# Patient Record
Sex: Female | Born: 1955
Health system: Southern US, Community
[De-identification: ages and names within clinical notes are randomized; demographics above are authoritative.]

## PROBLEM LIST (undated history)

## (undated) DIAGNOSIS — I471 Supraventricular tachycardia, unspecified: Secondary | ICD-10-CM

## (undated) DIAGNOSIS — M8580 Other specified disorders of bone density and structure, unspecified site: Secondary | ICD-10-CM

## (undated) DIAGNOSIS — E7800 Pure hypercholesterolemia, unspecified: Secondary | ICD-10-CM

## (undated) DIAGNOSIS — I341 Nonrheumatic mitral (valve) prolapse: Secondary | ICD-10-CM

## (undated) DIAGNOSIS — T7840XA Allergy, unspecified, initial encounter: Secondary | ICD-10-CM

## (undated) DIAGNOSIS — E785 Hyperlipidemia, unspecified: Secondary | ICD-10-CM

## (undated) DIAGNOSIS — J45909 Unspecified asthma, uncomplicated: Secondary | ICD-10-CM

## (undated) HISTORY — DX: Supraventricular tachycardia: I47.1

## (undated) HISTORY — DX: Hyperlipidemia, unspecified: E78.5

## (undated) HISTORY — DX: Pure hypercholesterolemia, unspecified: E78.00

## (undated) HISTORY — DX: Unspecified asthma, uncomplicated: J45.909

## (undated) HISTORY — DX: Other specified disorders of bone density and structure, unspecified site: M85.80

## (undated) HISTORY — DX: Nonrheumatic mitral (valve) prolapse: I34.1

## (undated) HISTORY — DX: Supraventricular tachycardia, unspecified: I47.10

## (undated) HISTORY — DX: Allergy, unspecified, initial encounter: T78.40XA

## (undated) HISTORY — PX: SPINE SURGERY: SHX786

---

## 1986-09-22 HISTORY — PX: TUBAL LIGATION: SHX77

## 1998-09-22 HISTORY — PX: LUMBAR FUSION: SHX111

## 1999-02-22 ENCOUNTER — Ambulatory Visit (HOSPITAL_COMMUNITY): Admission: RE | Admit: 1999-02-22 | Discharge: 1999-02-22 | Payer: Self-pay | Admitting: Family Medicine

## 1999-04-05 ENCOUNTER — Encounter: Payer: Self-pay | Admitting: Neurosurgery

## 1999-04-09 ENCOUNTER — Encounter: Payer: Self-pay | Admitting: Neurosurgery

## 1999-04-09 ENCOUNTER — Inpatient Hospital Stay (HOSPITAL_COMMUNITY): Admission: RE | Admit: 1999-04-09 | Discharge: 1999-04-12 | Payer: Self-pay | Admitting: Neurosurgery

## 1999-07-08 ENCOUNTER — Other Ambulatory Visit: Admission: RE | Admit: 1999-07-08 | Discharge: 1999-07-08 | Payer: Self-pay | Admitting: Obstetrics and Gynecology

## 1999-07-16 ENCOUNTER — Encounter: Admission: RE | Admit: 1999-07-16 | Discharge: 1999-07-16 | Payer: Self-pay | Admitting: Obstetrics and Gynecology

## 1999-07-16 ENCOUNTER — Encounter: Payer: Self-pay | Admitting: Obstetrics and Gynecology

## 1999-07-16 ENCOUNTER — Encounter: Admission: RE | Admit: 1999-07-16 | Discharge: 1999-08-20 | Payer: Self-pay | Admitting: Neurosurgery

## 2000-10-12 ENCOUNTER — Other Ambulatory Visit: Admission: RE | Admit: 2000-10-12 | Discharge: 2000-10-12 | Payer: Self-pay | Admitting: Obstetrics and Gynecology

## 2001-04-06 ENCOUNTER — Other Ambulatory Visit: Admission: RE | Admit: 2001-04-06 | Discharge: 2001-04-06 | Payer: Self-pay | Admitting: Obstetrics and Gynecology

## 2003-05-26 ENCOUNTER — Encounter: Payer: Self-pay | Admitting: Rheumatology

## 2003-05-26 ENCOUNTER — Ambulatory Visit (HOSPITAL_COMMUNITY): Admission: RE | Admit: 2003-05-26 | Discharge: 2003-05-26 | Payer: Self-pay | Admitting: Rheumatology

## 2003-06-05 ENCOUNTER — Other Ambulatory Visit: Admission: RE | Admit: 2003-06-05 | Discharge: 2003-06-05 | Payer: Self-pay | Admitting: *Deleted

## 2003-06-09 ENCOUNTER — Encounter: Admission: RE | Admit: 2003-06-09 | Discharge: 2003-07-06 | Payer: Self-pay | Admitting: Rheumatology

## 2004-12-02 ENCOUNTER — Other Ambulatory Visit: Admission: RE | Admit: 2004-12-02 | Discharge: 2004-12-02 | Payer: Self-pay | Admitting: *Deleted

## 2005-07-08 ENCOUNTER — Encounter: Admission: RE | Admit: 2005-07-08 | Discharge: 2005-07-08 | Payer: Self-pay | Admitting: *Deleted

## 2007-09-03 ENCOUNTER — Encounter: Payer: Self-pay | Admitting: Cardiovascular Disease

## 2008-06-20 ENCOUNTER — Other Ambulatory Visit: Admission: RE | Admit: 2008-06-20 | Discharge: 2008-06-20 | Payer: Self-pay | Admitting: Obstetrics and Gynecology

## 2008-07-21 ENCOUNTER — Encounter: Payer: Self-pay | Admitting: Cardiovascular Disease

## 2008-07-27 ENCOUNTER — Encounter: Payer: Self-pay | Admitting: Cardiovascular Disease

## 2008-08-08 ENCOUNTER — Other Ambulatory Visit: Admission: RE | Admit: 2008-08-08 | Discharge: 2008-08-08 | Payer: Self-pay | Admitting: Obstetrics and Gynecology

## 2009-08-14 ENCOUNTER — Encounter (INDEPENDENT_AMBULATORY_CARE_PROVIDER_SITE_OTHER): Payer: Self-pay | Admitting: *Deleted

## 2009-08-14 ENCOUNTER — Encounter: Admission: RE | Admit: 2009-08-14 | Discharge: 2009-08-14 | Payer: Self-pay | Admitting: Obstetrics & Gynecology

## 2009-08-15 ENCOUNTER — Ambulatory Visit: Payer: Self-pay | Admitting: Gastroenterology

## 2009-09-19 ENCOUNTER — Ambulatory Visit: Payer: Self-pay | Admitting: Gastroenterology

## 2009-09-24 ENCOUNTER — Encounter: Payer: Self-pay | Admitting: Gastroenterology

## 2009-10-26 ENCOUNTER — Encounter: Payer: Self-pay | Admitting: Cardiovascular Disease

## 2009-10-26 DIAGNOSIS — E7800 Pure hypercholesterolemia, unspecified: Secondary | ICD-10-CM

## 2009-10-26 DIAGNOSIS — R079 Chest pain, unspecified: Secondary | ICD-10-CM

## 2009-10-26 DIAGNOSIS — R5383 Other fatigue: Secondary | ICD-10-CM

## 2009-10-26 DIAGNOSIS — R002 Palpitations: Secondary | ICD-10-CM | POA: Insufficient documentation

## 2009-10-26 DIAGNOSIS — R5381 Other malaise: Secondary | ICD-10-CM

## 2009-10-29 ENCOUNTER — Ambulatory Visit: Payer: Self-pay | Admitting: Cardiovascular Disease

## 2009-10-29 DIAGNOSIS — Z8679 Personal history of other diseases of the circulatory system: Secondary | ICD-10-CM | POA: Insufficient documentation

## 2009-10-30 ENCOUNTER — Ambulatory Visit: Payer: Self-pay | Admitting: Cardiovascular Disease

## 2009-10-31 ENCOUNTER — Encounter: Payer: Self-pay | Admitting: Cardiovascular Disease

## 2009-10-31 ENCOUNTER — Ambulatory Visit: Payer: Self-pay

## 2009-10-31 ENCOUNTER — Ambulatory Visit: Payer: Self-pay | Admitting: Cardiovascular Disease

## 2009-10-31 ENCOUNTER — Ambulatory Visit (HOSPITAL_COMMUNITY): Admission: RE | Admit: 2009-10-31 | Discharge: 2009-10-31 | Payer: Self-pay | Admitting: Cardiovascular Disease

## 2009-11-01 ENCOUNTER — Encounter (INDEPENDENT_AMBULATORY_CARE_PROVIDER_SITE_OTHER): Payer: Self-pay | Admitting: *Deleted

## 2009-11-01 LAB — CONVERTED CEMR LAB
Albumin: 4.3 g/dL (ref 3.5–5.2)
Basophils Relative: 0.1 % (ref 0.0–3.0)
Bilirubin, Direct: 0 mg/dL (ref 0.0–0.3)
CO2: 29 meq/L (ref 19–32)
Chloride: 108 meq/L (ref 96–112)
Cholesterol: 191 mg/dL (ref 0–200)
Eosinophils Relative: 1 % (ref 0.0–5.0)
HCT: 36.7 % (ref 36.0–46.0)
Hemoglobin: 12.5 g/dL (ref 12.0–15.0)
LDL Cholesterol: 133 mg/dL — ABNORMAL HIGH (ref 0–99)
Lymphs Abs: 1.4 10*3/uL (ref 0.7–4.0)
MCV: 94.5 fL (ref 78.0–100.0)
Monocytes Absolute: 0.3 10*3/uL (ref 0.1–1.0)
Monocytes Relative: 6.1 % (ref 3.0–12.0)
Neutro Abs: 3.8 10*3/uL (ref 1.4–7.7)
Potassium: 4.6 meq/L (ref 3.5–5.1)
TSH: 0.78 microintl units/mL (ref 0.35–5.50)
Total CHOL/HDL Ratio: 4
Total Protein: 7.5 g/dL (ref 6.0–8.3)
Triglycerides: 66 mg/dL (ref 0.0–149.0)
VLDL: 13.2 mg/dL (ref 0.0–40.0)
WBC: 5.6 10*3/uL (ref 4.5–10.5)

## 2009-12-20 ENCOUNTER — Telehealth: Payer: Self-pay | Admitting: Cardiovascular Disease

## 2009-12-27 ENCOUNTER — Ambulatory Visit: Payer: Self-pay | Admitting: Cardiovascular Disease

## 2009-12-27 DIAGNOSIS — J45909 Unspecified asthma, uncomplicated: Secondary | ICD-10-CM

## 2009-12-28 ENCOUNTER — Ambulatory Visit: Payer: Self-pay | Admitting: Internal Medicine

## 2009-12-28 DIAGNOSIS — I471 Supraventricular tachycardia, unspecified: Secondary | ICD-10-CM | POA: Insufficient documentation

## 2010-02-14 ENCOUNTER — Telehealth: Payer: Self-pay | Admitting: Cardiovascular Disease

## 2010-04-02 ENCOUNTER — Ambulatory Visit: Payer: Self-pay | Admitting: Cardiovascular Disease

## 2010-05-20 ENCOUNTER — Ambulatory Visit: Payer: Self-pay | Admitting: Internal Medicine

## 2010-07-02 ENCOUNTER — Telehealth: Payer: Self-pay | Admitting: Internal Medicine

## 2010-07-04 ENCOUNTER — Telehealth: Payer: Self-pay | Admitting: Internal Medicine

## 2010-08-12 ENCOUNTER — Encounter: Payer: Self-pay | Admitting: Internal Medicine

## 2010-08-22 ENCOUNTER — Ambulatory Visit: Payer: Self-pay | Admitting: Internal Medicine

## 2010-08-22 HISTORY — PX: OTHER SURGICAL HISTORY: SHX169

## 2010-08-22 LAB — CONVERTED CEMR LAB
BUN: 9 mg/dL (ref 6–23)
Basophils Absolute: 0 10*3/uL (ref 0.0–0.1)
Basophils Relative: 0.5 % (ref 0.0–3.0)
CO2: 28 meq/L (ref 19–32)
Chloride: 101 meq/L (ref 96–112)
Creatinine, Ser: 0.7 mg/dL (ref 0.4–1.2)
Eosinophils Absolute: 0.1 10*3/uL (ref 0.0–0.7)
Glucose, Bld: 90 mg/dL (ref 70–99)
MCHC: 33.7 g/dL (ref 30.0–36.0)
MCV: 94.4 fL (ref 78.0–100.0)
Monocytes Absolute: 0.3 10*3/uL (ref 0.1–1.0)
Neutrophils Relative %: 56.7 % (ref 43.0–77.0)
Platelets: 239 10*3/uL (ref 150.0–400.0)
Prothrombin Time: 10.1 s (ref 9.7–11.8)
RBC: 4.03 M/uL (ref 3.87–5.11)
RDW: 12.9 % (ref 11.5–14.6)

## 2010-08-27 ENCOUNTER — Telehealth: Payer: Self-pay | Admitting: Internal Medicine

## 2010-08-29 ENCOUNTER — Ambulatory Visit (HOSPITAL_COMMUNITY)
Admission: RE | Admit: 2010-08-29 | Discharge: 2010-08-29 | Payer: Self-pay | Source: Home / Self Care | Admitting: Internal Medicine

## 2010-09-20 ENCOUNTER — Encounter
Admission: RE | Admit: 2010-09-20 | Discharge: 2010-09-20 | Payer: Self-pay | Source: Home / Self Care | Attending: Obstetrics & Gynecology | Admitting: Obstetrics & Gynecology

## 2010-10-07 ENCOUNTER — Ambulatory Visit
Admission: RE | Admit: 2010-10-07 | Discharge: 2010-10-07 | Payer: Self-pay | Source: Home / Self Care | Attending: Internal Medicine | Admitting: Internal Medicine

## 2010-10-07 ENCOUNTER — Encounter: Payer: Self-pay | Admitting: Internal Medicine

## 2010-10-24 ENCOUNTER — Telehealth: Payer: Self-pay | Admitting: Internal Medicine

## 2010-10-24 NOTE — Progress Notes (Signed)
Summary: SVT ablation  Phone Note Outgoing Call   Call placed by: Dennis Bast, RN, BSN,  July 02, 2010 12:01 PM Call placed to: Patient Summary of Call:  I called to schedule her for her SVT ablation.  Patient states she goes back and forth on doing the procedure at all.  She says her calander is very full and she may be traveling some.  So, she is going to call me back as to a date which she wants to do procedure. Dennis Bast, RN, BSN  July 02, 2010 12:03 PM

## 2010-10-24 NOTE — Letter (Signed)
Summary: Family Practice at [facility] at [facility]   Imported By: Earl Many 10/26/2009 17:46:52  _____________________________________________________________________  External Attachment:    Type:   Image     Comment:   External Document

## 2010-10-24 NOTE — Miscellaneous (Signed)
  Clinical Lists Changes  Medications: Changed medication from PRAVACHOL 20 MG TABS (PRAVASTATIN SODIUM) 1 tab by mouth once daily to PRAVASTATIN SODIUM 40 MG TABS (PRAVASTATIN SODIUM) Take one tablet by mouth daily at bedtime - Signed Rx of PRAVASTATIN SODIUM 40 MG TABS (PRAVASTATIN SODIUM) Take one tablet by mouth daily at bedtime;  #30 x 12;  Signed;  Entered by: Deliah Goody, RN;  Authorized by: Colon Branch, MD, [facility];  Method used: Electronically to [facility]*, [address], [address] Shipping/mailing, [place], [place]  [address], Ph: [phone], Fax: [phone]    Prescriptions: PRAVASTATIN SODIUM 40 MG TABS (PRAVASTATIN SODIUM) Take one tablet by mouth daily at bedtime  #30 x 12   Entered by:   Deliah Goody, RN   Authorized by:   Colon Branch, MD, [facility]   Signed by:   Deliah Goody, RN on 11/01/2009   Method used:   Electronically to        [facility]* (retail)       [address]       [address] Shipping/mailing       [place], [place]  [address]       Ph: [phone]       Fax: [phone]   RxID:   [id number]

## 2010-10-24 NOTE — Assessment & Plan Note (Signed)
Summary: EVAL FOR   Visit Type:  Initial Consult Referring Provider:  Dr Eden Emms Primary Provider:  [name]   History of Present Illness: Teresa Fitzgerald is a pleasant 55 yo WF with a h/o asthma and recently diagnosed SVT who presents for EP consultation.  She reports having palpitations "for years" but feels that they have recently worsened.  She describes episodes as abrupt onset and offset of tachypalpitations with associated SOB.  She often feels her heart beat into her neck.  She feels "washed out" afterwards.  She is unaware of triggers for her episodes and often finds that episodes occur at rest.  She reports that presently she has episodes on a daily basis.  Episodes typically last 2-3 minutes.  She denies any more prolonged episodes.  She reports having episodes of syncope in her 30s but denies recent episodes.  She has been reluctant to take medicines for this.  Current Medications (verified): 1)  Pravastatin Sodium 40 Mg Tabs (Pravastatin Sodium) .... Take One Tablet By Mouth Daily At Bedtime 2)  Advair Diskus 250-50 Mcg/dose Aepb (Fluticasone-Salmeterol) .Marland Kitchen.. 1 Puff Two Times A Day 3)  Cetirizine Hcl 10 Mg Tabs (Cetirizine Hcl) .Marland Kitchen.. 1 Tab By Mouth Once Daily 4)  Calcarb 600 1500 Mg Tabs (Calcium Carbonate) .Marland Kitchen.. 1 Tab By Mouth Two Times A Day 5)  Magnesium Oxide 500 Mg Tabs (Magnesium Oxide) .Marland Kitchen.. 1 Tab By Mouth Once Daily 6)  Fish Oil 1200 Mg Caps (Omega-3 Fatty Acids) .... 2 Caps Once Daily 7)  Vitamin D (Ergocalciferol) 50000 Unit Caps (Ergocalciferol) .... Every 2 Weeks 8)  Aspirin Ec 325 Mg Tbec (Aspirin) .... Take One Tablet By Mouth Daily  Allergies (verified): No Known Drug Allergies  Past History:  Past Medical History: SVT HYPERCHOLESTEROLEMIA (ICD-272.0) Mitral valve prolapse Asthma Allergic rhinitis  Past Surgical History: lumbar fusion 2000  Family History: Reviewed history from 10/26/2009 and no changes required. asthma Cancer Hypertension  Social  History: Works as a Sports coach for [employer] Regular Exercise - yes Yoga Married x 2  Lives in [place]. 3 daughters Nonsmoker Nondrinker  Review of Systems       All systems are reviewed and negative except as listed in the HPI.   Vital Signs:  Patient profile:   55 year old female Height:      67 inches Weight:      147 pounds BMI:     23.11 Pulse rate:   63 / minute BP sitting:   122 / 84  (left arm)  Vitals Entered By: Laurance Flatten CMA (December 28, 2009 9:24 AM)  Physical Exam  General:  Well developed, well nourished, in no acute distress. Head:  normocephalic and atraumatic Eyes:  PERRLA/EOM intact; conjunctiva and lids normal. Mouth:  Teeth, gums and palate normal. Oral mucosa normal. Neck:  Neck supple, no JVD. No masses, thyromegaly or abnormal cervical nodes. Lungs:  Clear bilaterally to auscultation and percussion. Heart:  Non-displaced PMI, chest non-tender; regular rate and rhythm, S1, S2 without murmurs, rubs or gallops. Carotid upstroke normal, no bruit. Normal abdominal aortic size, no bruits. Femorals normal pulses, no bruits. Pedals normal pulses. No edema, no varicosities. Abdomen:  Bowel sounds positive; abdomen soft and non-tender without masses, organomegaly, or hernias noted. No hepatosplenomegaly. Msk:  Back normal, normal gait. Muscle strength and tone normal. Pulses:  pulses normal in all 4 extremities Extremities:  No clubbing or cyanosis. Neurologic:  Alert and oriented x 3. Skin:  Intact without lesions or rashes.  Cervical Nodes:  no significant adenopathy Psych:  Normal affect.   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  I have reviewed her echo which reveals no significant  structural concerns.  I also reviewed her event monitor which reveals a long RP narrow complex tachycardia, possibly atrial tachycardia. Therapeutic strategies for SVT including medicine and ablation were discussed in detail with the patient today. Risk, benefits, and alternatives to EP study and radiofrequency ablation were also discussed in detail today.  At this point, the patient is not ready to proceed with ablation. She also does not want to take a daily medicine. I have therefore recommended that she take cardizem 30mg  as needed for episodes as a "pill-in-pocket" approach.  If cardizem is not effective then she could use flecainide "pill in pocket" or take daily flecainide if episodes increase. I think that she would be a good candidate for carto guided ablation, however, she is not ready for this. I have provided handouts on SVT and ablation today.  She will follow closely with Dr Eden Emms and contact my office if she wishes to proceed with ablation.  Other Orders: EKG w/ Interpretation (93000)  Patient Instructions: 1)  Your physician has recommended you make the following change in your medication: Start Cardizem 30mg  as needed . 2)  Your physician recommends that you schedule a follow-up as scheduled with Dr. Eden Emms. Prescriptions: CARDIZEM 30 MG TABS (DILTIAZEM HCL) 1 tablet every 6 hours as needed .  #30 x 2   Entered by:   Laurance Flatten CMA   Authorized by:   Hillis Range, MD   Signed by:   Laurance Flatten CMA on 12/28/2009   Method used:   Electronically to        [facility]* (retail)       [address]       [address] Shipping/mailing       [place], [place]  [address]       Ph: [phone]       Fax: [phone]   RxID:   [id number]

## 2010-10-24 NOTE — Progress Notes (Signed)
Summary: need clarification  Phone Note Refill Request Call back at [phone] Message from:  Pharmacy on [facility]  Refills Requested: Medication #1:  CARDIZEM 30 MG TABS 1 tablet every 6 hours as needed .. they need clarification  Initial call taken by: Omer Jack,  Feb 14, 2010 12:21 PM  Follow-up for Phone Call        Verified with EP this is correct. Told pharmacy to fill as witten Follow-up by: [name],  Feb 14, 2010 3:22 PM

## 2010-10-24 NOTE — Assessment & Plan Note (Signed)
Summary: F3M   Visit Type:  Follow-up Referring Hayat Warbington:  Dr Eden Emms Primary Devota Viruet:  [name]   History of Present Illness: Ms Teresa Fitzgerald is a pleasant 55 yo WF with a h/o asthma and long RP SVT who presents for EP follow-up.  She reports having palpitations "for years" but feels that they have recently worsened.  She describes episodes as abrupt onset and offset of tachypalpitations with associated SOB.  She often feels her heart beat into her neck.  She feels "washed out" afterwards.  She is unaware of triggers for her episodes and often finds that episodes occur at rest.  She reports that presently she has episodes on a daily basis.  Episodes typically last 2-3 minutes.  She denies any more prolonged episodes.  She has been taking daily cardizem without improvement.  Current Medications (verified): 1)  Pravastatin Sodium 40 Mg Tabs (Pravastatin Sodium) .... Take One Tablet By Mouth Daily At Bedtime 2)  Advair Diskus 250-50 Mcg/dose Aepb (Fluticasone-Salmeterol) .Marland Kitchen.. 1 Puff Two Times A Day 3)  Cetirizine Hcl 10 Mg Tabs (Cetirizine Hcl) .Marland Kitchen.. 1 Tab By Mouth Once Daily 4)  Calcarb 600 1500 Mg Tabs (Calcium Carbonate) .Marland Kitchen.. 1 Tab By Mouth Two Times A Day 5)  Magnesium Oxide 500 Mg Tabs (Magnesium Oxide) .Marland Kitchen.. 1 Tab By Mouth Once Daily 6)  Fish Oil 1200 Mg Caps (Omega-3 Fatty Acids) .... 2 Caps Once Daily 7)  Vitamin D (Ergocalciferol) 50000 Unit Caps (Ergocalciferol) .... Every 2 Weeks 8)  Aspirin 81 Mg Tbec (Aspirin) .... Take One Tablet By Mouth Daily 9)  Diltiazem Hcl Er Beads 180 Mg Xr24h-Cap (Diltiazem Hcl Er Beads) .... Take One Capsule By Mouth Daily  Allergies (verified): No Known Drug Allergies  Past History:  Past Medical History: Reviewed history from 12/28/2009 and no changes required. SVT HYPERCHOLESTEROLEMIA (ICD-272.0) Mitral valve prolapse Asthma Allergic rhinitis  Past Surgical History: Reviewed history from 12/28/2009 and no changes required. lumbar fusion  2000  Family History: Reviewed history from 10/26/2009 and no changes required. asthma Cancer Hypertension  Social History: Reviewed history from 12/28/2009 and no changes required. 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 3 daughters Nonsmoker Nondrinker  Review of Systems       All systems are reviewed and negative except as listed in the HPI.   Vital Signs:  Patient profile:   55 year old female Height:      67 inches Weight:      143 pounds BMI:     22.48 BP sitting:   128 / 70  (left arm)  Vitals Entered By: Laurance Flatten CMA (May 20, 2010 12:12 PM)  Physical Exam  General:  Well developed, well nourished, in no acute distress. Head:  normocephalic and atraumatic Eyes:  PERRLA/EOM intact; conjunctiva and lids normal. Mouth:  Teeth, gums and palate normal. Oral mucosa normal. Neck:  Neck supple, no JVD. No masses, thyromegaly or abnormal cervical nodes. Lungs:  Clear bilaterally to auscultation and percussion. Heart:  Non-displaced PMI, chest non-tender; regular rate and rhythm, S1, S2 without murmurs, rubs or gallops. Carotid upstroke normal, no bruit. Normal abdominal aortic size, no bruits. Femorals normal pulses, no bruits. Pedals normal pulses. No edema, no varicosities. Abdomen:  Bowel sounds positive; abdomen soft and non-tender without masses, organomegaly, or hernias noted. No hepatosplenomegaly. Msk:  Back normal, normal gait. Muscle strength and tone normal. Pulses:  pulses normal in all 4 extremities Extremities:  No clubbing or cyanosis. Neurologic:  Alert and oriented x 3. Skin:  Intact without lesions or rashes. Cervical Nodes:  no significant adenopathy Psych:  Normal affect.   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  Therapeutic strategies for SVT including medicine and ablation were discussed in detail with the patient today. Risk, benefits, and alternatives to EP study and radiofrequency ablation  were also discussed in detail today. These risks include but are not limited to stroke, bleeding, vascular damage, tamponade, perforation, damage to the heart other structures, AV block requiring PPM, worsening renal function, and death. The patient understands these risk and wishes to proceed.  We will schedule carto guided EPS at the next available time.  Orders: EKG w/ Interpretation (93000)  Patient Instructions: 1)  Your physician has recommended that you have an ablation.  Catheter ablation is a medical procedure used to treat some cardiac arrhythmias (irregular heartbeats). During catheter ablation, a long, thin, flexible tube is put into a blood vessel in your groin (upper thigh), or neck. This tube is called an ablation catheter. It is then guided to your heart through the blood vessel. Radiofrequency waves destroy small areas of heart tissue where abnormal heartbeats may cause an arrhythmia to start.  Please see the instruction sheet given to you today. 2)  Dr. Jenel Lucks nurse, Tresa Endo, will contact you to schedule this.

## 2010-10-24 NOTE — Progress Notes (Signed)
Summary: Calling to schedule ablation  Phone Note Call from Patient Call back at Work Phone [phone]   Caller: Patient Summary of Call: Pt calling to schedule ablation Initial call taken by: Judie Grieve,  July 04, 2010 12:54 PM  Follow-up for Phone Call        SVT  08/29/10  labs 12/01/11Kelly Elpidio Galea, BSN  July 05, 2010 4:58 PM

## 2010-10-24 NOTE — Letter (Signed)
Summary: [facility]   Imported By: Earl Many 10/26/2009 17:55:57  _____________________________________________________________________  External Attachment:    Type:   Image     Comment:   External Document

## 2010-10-24 NOTE — Assessment & Plan Note (Signed)
Summary: Teresa Fitzgerald   Visit Type:  Follow-up Referring Provider:  Dr Eden Emms Primary Provider:  [name]   History of Present Illness: Teresa Fitzgerald is a pleasant 55 yo WF with a h/o asthma and long RP SVT who presents for EP follow-up.  She was evaluated by me and recently underwent Ep study during which she had no dual av nodal physiology or APs.  Unfortunately, her tachycardia could not be induced and therefore ablation could not be performed. She continues to reports short (< 1-2 minutes) episodes of abrupt onset/ termination and palpitations every 1-2 days.  These episodes are improved but not controlled with cardizem.She denies symptoms of palpitations, chest pain, shortness of breath, orthopnea, PND, lower extremity edema, dizziness, presyncope, syncope, or neurologic sequela. The patient is tolerating medications without difficulties and is otherwise without complaint today.   Current Medications (verified): 1)  Pravastatin Sodium 40 Mg Tabs (Pravastatin Sodium) .... Take One Tablet By Mouth Daily At Bedtime 2)  Advair Diskus 250-50 Mcg/dose Aepb (Fluticasone-Salmeterol) .Marland Kitchen.. 1 Puff Two Times A Day 3)  Cetirizine Hcl 10 Mg Tabs (Cetirizine Hcl) .Marland Kitchen.. 1 Tab By Mouth Once Daily 4)  Calcarb 600 1500 Mg Tabs (Calcium Carbonate) .Marland Kitchen.. 1 Tab By Mouth Two Times A Day 5)  Magnesium Oxide 500 Mg Tabs (Magnesium Oxide) .Marland Kitchen.. 1 Tab By Mouth Once Daily 6)  Fish Oil 1200 Mg Caps (Omega-3 Fatty Acids) .... 2 Caps Once Daily 7)  Vitamin D (Ergocalciferol) 50000 Unit Caps (Ergocalciferol) .... Every 2 Weeks 8)  Aspirin 81 Mg Tbec (Aspirin) .... Take One Tablet By Mouth Daily 9)  Diltiazem Hcl Er Beads 240 Mg Xr24h-Cap (Diltiazem Hcl Er Beads) .... Take One Capsule By Mouth Daily  Allergies (verified): No Known Drug Allergies  Past History:  Past Medical History: SVT, presumed atrial tachycardia not inducible on EPS 12/11 HYPERCHOLESTEROLEMIA (ICD-272.0) Mitral valve prolapse Asthma Allergic  rhinitis  Past Surgical History: lumbar fusion 2000 EP study 12/11  Social History: Reviewed history from 12/28/2009 and no changes required. 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 3 daughters Nonsmoker Nondrinker  Review of Systems       All systems are reviewed and negative except as listed in the HPI.   Vital Signs:  Patient profile:   56 year old female Height:      67 inches Weight:      144 pounds BMI:     22.64 Pulse rate:   60 / minute BP sitting:   120 / 80  (left arm)  Vitals Entered By: Laurance Flatten CMA (October 07, 2010 10:31 AM)  Physical Exam  General:  Well developed, well nourished, in no acute distress. Head:  normocephalic and atraumatic Eyes:  PERRLA/EOM intact; conjunctiva and lids normal. Mouth:  Teeth, gums and palate normal. Oral mucosa normal. Neck:  supple Lungs:  Clear bilaterally to auscultation and percussion. Heart:  Non-displaced PMI, chest non-tender; regular rate and rhythm, S1, S2 without murmurs, rubs or gallops. Carotid upstroke normal, no bruit. Normal abdominal aortic size, no bruits. Femorals normal pulses, no bruits. Pedals normal pulses. No edema, no varicosities. Abdomen:  Bowel sounds positive; abdomen soft and non-tender without masses, organomegaly, or hernias noted. No hepatosplenomegaly. Msk:  Back normal, normal gait. Muscle strength and tone normal. Extremities:  No clubbing or cyanosis. Neurologic:  Alert and oriented x 3.   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  Unfortunately, her SVT was not inducible and therefore could not be ablated at that time.  Clinically, I suspect Atach.  I have reviewed her LifeWatch event  monitor from 2/11 which reveals long RP narrow complex tachycardia.  This is clearly not afib.  At this time, I have recommended flecainide.  The patient remains very clear in her decision to avoid AAD therapy.  We will therefore stop diltiazem and start verapamil 240mg  daily today.  She will return in 3 months. IF she decides to try flecainide, she will contact our office.  Patient Instructions: 1)  Your physician has recommended you make the following change in your medication: stop Cardizem and start Verapamil 240mg  daily 2)  Your physician wants you to follow-up in: 3 months with DrAllred  You will receive a reminder letter in the mail two months in advance. If you don't receive a letter, please call our office to schedule the follow-up appointment. Prescriptions: VERAPAMIL HCL CR 240 MG CR-TABS (VERAPAMIL HCL) one by mouth daily  #30 x 11   Entered by:   Dennis Bast, RN, BSN   Authorized by:   Hillis Range, MD   Signed by:   Dennis Bast, RN, BSN on 10/07/2010   Method used:   Electronically to        [facility]* (retail)       [address]       [address]. Shipping/mailing       [place], [place]  [address]       Ph: [phone]       Fax: [phone]   RxID:   [id number]

## 2010-10-24 NOTE — Miscellaneous (Signed)
Summary: Outpatient Coinsurance Notice  Outpatient Coinsurance Notice   Imported By: Marylou Mccoy 11/20/2009 08:30:23  _____________________________________________________________________  External Attachment:    Type:   Image     Comment:   External Document

## 2010-10-24 NOTE — Letter (Signed)
Summary: ELectrophysiology/Ablation Procedure Instructions  [facility], Main Office  [address]   [place], [place] [address]   Phone: [phone]  Fax: [phone]     Electrophysiology/Ablation Procedure Instructions    You are scheduled for a(n) SVT ablation on 08/29/10 at 7;30am with Dr. Johney Frame.  1.  Please come to the Short Stay Center at [facility] at 5:30am on the day of your procedure.  2.  Come prepared to stay overnight.   Please bring your insurance cards and a list of your medications.  3.  Come to the [facility] office on 08/22/10 for lab work.  .  You do not have to be fasting.  4.  Do not have anything to eat or drink after midnight the night before your procedure.  5.   All of your remaining medications may be taken with a small amount of water.  6.  Educational material received:  Ablation   * Occasionally, EP studies and ablations can become lengthy.  Please make your family aware of this before your procedure starts.  Average time ranges from 2-8 hours for EP studies/ablations.  Your physician will locate your family after the procedure with the results.  * If you have any questions after you get home, please call the office at [phone].  Anselm Pancoast

## 2010-10-24 NOTE — Assessment & Plan Note (Signed)
Summary: 3 month rov .sl   Referring Provider:  Dr Eden Emms Primary Provider:  [name]  CC:  sob.  History of Present Illness: Teresa Fitzgerald is seen today for f/U of SVT, palpitatoins, and MVP.  She saw Dr Johney Frame in April and he felt she is a good candidate for ablation when she is willing.  Script for as needed cardizem given at this time since she does not want to be on daily med.  She has had some success with cardizem but is taking it daily and wants to switch to long acting.  She will reconsider ablation and is comfortable with Dr Johney Frame.  We will arrange for her to see him on 8/29  Palpitations continue on a daily basis.  F/U cholesterol in 2/12 needed.  Echo with mild MVP and trace MR  Current Problems (verified): 1)  Svt/ Psvt/ Pat  (ICD-427.0) 2)  Dyspnea  (ICD-786.05) 3)  Mitral Valve Prolapse, Hx of  (ICD-V12.50) 4)  Chest Pain  (ICD-786.50) 5)  Fatigue  (ICD-780.79) 6)  Palpitations  (ICD-785.1) 7)  Hypercholesterolemia  (ICD-272.0)  Current Medications (verified): 1)  Pravastatin Sodium 40 Mg Tabs (Pravastatin Sodium) .... Take One Tablet By Mouth Daily At Bedtime 2)  Advair Diskus 250-50 Mcg/dose Aepb (Fluticasone-Salmeterol) .Marland Kitchen.. 1 Puff Two Times A Day 3)  Cetirizine Hcl 10 Mg Tabs (Cetirizine Hcl) .Marland Kitchen.. 1 Tab By Mouth Once Daily 4)  Calcarb 600 1500 Mg Tabs (Calcium Carbonate) .Marland Kitchen.. 1 Tab By Mouth Two Times A Day 5)  Magnesium Oxide 500 Mg Tabs (Magnesium Oxide) .Marland Kitchen.. 1 Tab By Mouth Once Daily 6)  Fish Oil 1200 Mg Caps (Omega-3 Fatty Acids) .... 2 Caps Once Daily 7)  Vitamin D (Ergocalciferol) 50000 Unit Caps (Ergocalciferol) .... Every 2 Weeks 8)  Aspirin 81 Mg Tbec (Aspirin) .... Take One Tablet By Mouth Daily 9)  Diltiazem Hcl Er Beads 120 Mg Xr24h-Cap (Diltiazem Hcl Er Beads) .... Take One Capsule By Mouth Daily  Allergies (verified): No Known Drug Allergies  Past History:  Past Medical History: Last updated: 12/28/2009 SVT HYPERCHOLESTEROLEMIA (ICD-272.0) Mitral  valve prolapse Asthma Allergic rhinitis  Past Surgical History: Last updated: 12/28/2009 lumbar fusion 2000  Family History: Last updated: 10/26/2009 asthma Cancer Hypertension  Social History: Last updated: 12/28/2009 Works as a Sports coach for [employer] Regular Exercise - yes Yoga Married x 2  Lives in [place]. 3 daughters Nonsmoker Nondrinker  Review of Systems       Denies fever, malais, weight loss, blurry vision, decreased visual acuity, cough, sputum, SOB, hemoptysis, pleuritic pain,, heartburn, abdominal pain, melena, lower extremity edema, claudication, or rash.   Vital Signs:  Patient profile:   55 year old female Height:      67 inches Weight:      144 pounds BMI:     22.64 Pulse rate:   64 / minute Resp:     12 per minute BP sitting:   118 / 80  (left arm)  Vitals Entered By: [name] (April 02, 2010 9:39 AM)  Physical Exam  General:  Affect appropriate Healthy:  appears stated age HEENT: normal Neck supple with no adenopathy JVP normal no bruits no thyromegaly Lungs clear with no wheezing and good diaphragmatic motion Heart:  S1/S2 no murmur,rub, gallop or click PMI normal Abdomen: benighn, BS positve, no tenderness, no AAA no bruit.  No HSM or HJR Distal pulses intact with no bruits No edema Neuro non-focal Skin warm and dry    Impression & Recommendations:  Problem # 1:  SVT/ PSVT/ PAT (ICD-427.0) Change to LA cardizem.  F/U [facility].   Her updated medication list for this problem includes:    Aspirin 81 Mg Tbec (Aspirin) .Marland Kitchen... Take one tablet by mouth daily    Diltiazem Hcl Er Beads 120 Mg Xr24h-cap (Diltiazem hcl er beads) .Marland Kitchen... Take one capsule by mouth daily  Problem # 2:  MITRAL VALVE PROLAPSE, HX OF (ICD-V12.50) Stable F/U echo in 2 years.  No SBE needed  Problem # 3:  HYPERCHOLESTEROLEMIA (ICD-272.0) At goal with no known vascular disease Her updated medication list for this problem includes:    Pravastatin  Sodium 40 Mg Tabs (Pravastatin sodium) .Marland Kitchen... Take one tablet by mouth daily at bedtime  CHOL: 191 (10/30/2009)   LDL: 133 (10/30/2009)   HDL: 44.60 (10/30/2009)   TG: 66.0 (10/30/2009)  Patient Instructions: 1)  Your physician recommends that you schedule a follow-up appointment in: 6 months 2)  Your physician has recommended you make the following change in your medication: STOP CARDIZEM 3)  START DILTIAZEM CD 120MG  ONE TABLET ONCE DAILY 4)  NEED TO SEE DR [facility] IN 3 MONTHS TO DISCUSS ABLATION Prescriptions: DILTIAZEM HCL ER BEADS 120 MG XR24H-CAP (DILTIAZEM HCL ER BEADS) Take one capsule by mouth daily  #30 x 12   Entered by:   Deliah Goody, RN   Authorized by:   Colon Branch, MD, [facility]   Signed by:   Deliah Goody, RN on 04/02/2010   Method used:   Electronically to        [facility]* (retail)       [address]       [address] Shipping/mailing       [place], [place]  [address]       Ph: [phone]       Fax: [phone]   RxID:   [id number]

## 2010-10-24 NOTE — Assessment & Plan Note (Signed)
Summary: np3/ PALPS, PT HAS UMR/ GD   CC:  palpitations also fatigue.  History of Present Illness: Teresa Fitzgerald is seen today at the request of Dr Doristine Counter.  She has a history of MVP, palpitatoins and elevated lipids.  Her last echo was over 10 years ago.  Her palpitations are chronic but worse over the last few months.  She thinks her allegies make them worse.  She denies associated dyspnea, SSCP, syncope or diaphoresis.  She dose yoga and exercise does not seem to make them worse.  She use to do SBE prophylaxis for her "MVP" but hasn't lately.  He last LDL was 172.  She stopped taking her statin for a while when she ran out and needs F/U labs.    Current Problems (verified): 1)  Chest Pain  (ICD-786.50) 2)  Fatigue  (ICD-780.79) 3)  Palpitations  (ICD-785.1) 4)  Hypercholesterolemia  (ICD-272.0)  Current Medications (verified): 1)  Pravachol 20 Mg Tabs (Pravastatin Sodium) .Marland Kitchen.. 1 Tab By Mouth Once Daily 2)  Advair Diskus 250-50 Mcg/dose Aepb (Fluticasone-Salmeterol) .... As Needed 3)  Cetirizine Hcl 10 Mg Tabs (Cetirizine Hcl) .Marland Kitchen.. 1 Tab By Mouth Once Daily 4)  Calcarb 600 1500 Mg Tabs (Calcium Carbonate) .Marland Kitchen.. 1 Tab By Mouth Two Times A Day 5)  Magnesium Oxide 500 Mg Tabs (Magnesium Oxide) .Marland Kitchen.. 1 Tab By Mouth Once Daily 6)  Fish Oil   Oil (Fish Oil) .Marland Kitchen.. 1 Tab By Mouth Once Daily 7)  Vitamin D (Ergocalciferol) 50000 Unit Caps (Ergocalciferol) .... Every 2 Weeks  Allergies (verified): No Known Drug Allergies  Past History:  Past Medical History: Last updated: 10/26/2009 Current Problems:  CHEST PAIN (ICD-786.50) FATIGUE (ICD-780.79) PALPITATIONS (ICD-785.1) HYPERCHOLESTEROLEMIA (ICD-272.0) Mitral valve abnormality  Family History: Last updated: 10/26/2009 asthma Cancer Hypertension  Social History: Last updated: 10/29/2009 HAs worked for [employer] Regular Exercise - yes Yoga Married x 2  3 daughters Nonsmoker Nondrinker  Social History: HAs worked for [employer] Regular Exercise - yes Yoga Married x 2  3 daughters Nonsmoker Nondrinker  Review of Systems       Denies fever, malais, weight loss, blurry vision, decreased visual acuity, cough, sputum, SOB, hemoptysis, pleuritic pain,s, heartburn, abdominal pain, melena, lower extremity edema, claudication, or rash.   Vital Signs:  Patient profile:   55 year old female Height:      67 inches Weight:      144 pounds BMI:     22.64 Pulse rate:   78 / minute Resp:     12 per minute BP sitting:   133 / 74  (right arm)  Vitals Entered By: [name] (October 29, 2009 11:54 AM)  Physical Exam  General:  Affect appropriate Healthy:  appears stated age HEENT: normal Neck supple with no adenopathy JVP normal no bruits no thyromegaly Lungs clear with no wheezing and good diaphragmatic motion Heart:  S1/S2 no murmur,rub, gallop or click PMI normal Abdomen: benighn, BS positve, no tenderness, no AAA no bruit.  No HSM or HJR Distal pulses intact with no bruits No edema Neuro non-focal Skin warm and dry    Impression & Recommendations:  Problem # 1:  PALPITATIONS (ICD-785.1) Event monitor Normal ECG doubt significant arrythmia Orders: Echocardiogram (Echo) Event (Event)  Problem # 2:  HYPERCHOLESTEROLEMIA (ICD-272.0) Check labs and Dr Doristine Counter to adjust meds accordingly Her updated medication list for this problem includes:    Pravachol 20 Mg Tabs (Pravastatin sodium) .Marland Kitchen... 1 tab by mouth once daily  Problem #  3:  MITRAL VALVE PROLAPSE, HX OF (ICD-V12.50) No bad murmurs.  F/U echo  Patient Instructions: 1)  Your physician recommends that you schedule a follow-up appointment in: AS NEEDED PENDING TEST RESULTS 2)  Your physician recommends that you return for lab work [id number] 3)  Your physician has requested that you have an echocardiogram.  Echocardiography is a painless test that uses sound waves to create images of your heart. It provides your doctor with  information about the size and shape of your heart and how well your heart's chambers and valves are working.  This procedure takes approximately one hour. There are no restrictions for this procedure. 4)  Your physician has recommended that you wear an event monitor.  Event monitors are medical devices that record the heart's electrical activity. Doctors most often use these monitors to diagnose arrhythmias. Arrhythmias are problems with the speed or rhythm of the heartbeat. The monitor is a small, portable device. You can wear one while you do your normal daily activities. This is usually used to diagnose what is causing palpitations/syncope (passing out).   EKG Report  Procedure date:  07/27/2008  Findings:      NSR 78 Normal ECG

## 2010-10-24 NOTE — Letter (Signed)
Summary: Patient Notice- Polyp Results  [facility]  [address] [place], [place] [address]   Phone: [phone]  Fax: [phone]        September 24, 2009 MRN: [id number]    Eleen Vea [address] [place], [place]  [address]    Dear Ms. Barnhart,  I am pleased to inform you that the colon polyp(s) removed during your recent colonoscopy was (were) found to be benign (no cancer detected) upon pathologic examination.  I recommend you have a repeat colonoscopy examination in 5_ years to look for recurrent polyps, as having colon polyps increases your risk for having recurrent polyps or even colon cancer in the future.  Should you develop new or worsening symptoms of abdominal pain, bowel habit changes or bleeding from the rectum or bowels, please schedule an evaluation with either your primary care physician or with me.  Additional information/recommendations:  _X_ No further action with gastroenterology is needed at this time. Please      follow-up with your primary care physician for your other healthcare      needs.  __ Please call [phone] to schedule a return visit to review your      situation.  __ Please keep your follow-up visit as already scheduled.  __ Continue treatment plan as outlined the day of your exam.  Please call us if you are having persistent problems or have questions about your condition that have not been fully answered at this time.  Sincerely,  Mardella Layman MD [facility]  This letter has been electronically signed by your physician.  Appended Document: Patient Notice- Polyp Results Letter mailed 1.5.11

## 2010-10-24 NOTE — Assessment & Plan Note (Signed)
Summary: rov/f/u monitor/dm   Visit Type:  f/u on monitor Primary Provider:  [name]  CC:  pt states still having palpatations and sob...  History of Present Illness: Teresa Fitzgerald is seen today in F/U for palpitations dyspnea and ? MVP.  I reviewed her echo which showed normal LV function and mild MR with no prolapse.  I reviewed her event monitor which showed multiple episodes of SVT.  It appeared to be a long RP tachycardia with right atrial morphology.  The patient is not keen on taking a medicine like Flecainide for a long period of time.  I discussed the case with Dr Graciela Husbands and he agreed that her strips likely represent a long RP tach and that Dr. Johney Frame wouild be the best person to consdier ablation with using the CART system.  I tried to answer all of the patients questions regarding the risks and benefits of ablationa and or anti-arrhythmic Rx but I clearly think that given the frequence of her symptoms and fairly young age that ablation is better.  She does not need anticoagulation.  She does have continued episodes of dyspnea that may represent bronchospasm or occult tachycardia since her primary Dr Teresa Fitzgerald has noted her to be in tachycardia without palpitations.  Since her echo was fairly benign , we will have her EPS evaluation before consdiering any further w/U of her dyspnea. She will continue her advair in the mean time.  Current Problems (verified): 1)  Mitral Valve Prolapse, Hx of  (ICD-V12.50) 2)  Chest Pain  (ICD-786.50) 3)  Fatigue  (ICD-780.79) 4)  Palpitations  (ICD-785.1) 5)  Hypercholesterolemia  (ICD-272.0)  Current Medications (verified): 1)  Pravastatin Sodium 40 Mg Tabs (Pravastatin Sodium) .... Take One Tablet By Mouth Daily At Bedtime 2)  Advair Diskus 250-50 Mcg/dose Aepb (Fluticasone-Salmeterol) .Marland Kitchen.. 1 Puff Two Times A Day 3)  Cetirizine Hcl 10 Mg Tabs (Cetirizine Hcl) .Marland Kitchen.. 1 Tab By Mouth Once Daily 4)  Calcarb 600 1500 Mg Tabs (Calcium Carbonate) .Marland Kitchen.. 1 Tab  By Mouth Two Times A Day 5)  Magnesium Oxide 500 Mg Tabs (Magnesium Oxide) .Marland Kitchen.. 1 Tab By Mouth Once Daily 6)  Fish Oil 1200 Mg Caps (Omega-3 Fatty Acids) .... 2 Caps Once Daily 7)  Vitamin D (Ergocalciferol) 50000 Unit Caps (Ergocalciferol) .... Every 2 Weeks 8)  Aspirin Ec 325 Mg Tbec (Aspirin) .... Take One Tablet By Mouth Daily  Allergies (verified): No Known Drug Allergies  Past History:  Past Medical History: Last updated: 10/26/2009 Current Problems:  CHEST PAIN (ICD-786.50) FATIGUE (ICD-780.79) PALPITATIONS (ICD-785.1) HYPERCHOLESTEROLEMIA (ICD-272.0) Mitral valve abnormality  Family History: Last updated: 10/26/2009 asthma Cancer Hypertension  Social History: Last updated: 10/29/2009 HAs worked for [employer] Regular Exercise - yes Yoga Married x 2  3 daughters Nonsmoker Nondrinker  Review of Systems       Denies fever, malais, weight loss, blurry vision, decreased visual acuity, cough, sputum,  hemoptysis, pleuritic pain, , heartburn, abdominal pain, melena, lower extremity edema, claudication, or rash.   Vital Signs:  Patient profile:   55 year old female Height:      67 inches Weight:      146 pounds BMI:     22.95 Pulse rate:   82 / minute Pulse rhythm:   regular BP sitting:   100 / 62  (left arm) Cuff size:   large  Vitals Entered By: Danielle Rankin, CMA (December 27, 2009 3:04 PM)  Physical Exam  General:  Affect appropriate Healthy:  appears stated age  HEENT: normal Neck supple with no adenopathy JVP normal no bruits no thyromegaly Lungs clear with no wheezing and good diaphragmatic motion Heart:  S1/S2 no murmur,rub, gallop or click PMI normal Abdomen: benighn, BS positve, no tenderness, no AAA no bruit.  No HSM or HJR Distal pulses intact with no bruits No edema Neuro non-focal Skin warm and dry    Impression & Recommendations:  Problem # 1:  MITRAL VALVE PROLAPSE, HX OF (ICD-V12.50) No prolapse in echo with only mild MR.   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  Patient does not want Flecainide.  Refer to [facility] for possible ablation Her updated medication list for this problem includes:    Aspirin Ec 325 Mg Tbec (Aspirin) .Marland Kitchen... Take one tablet by mouth daily  Orders: EP Referral (Cardiology EP Ref )  Problem # 3:  HYPERCHOLESTEROLEMIA (ICD-272.0) Continue pravastatin and F/U labs in 6 months Her updated medication list for this problem includes:    Pravastatin Sodium 40 Mg Tabs (Pravastatin sodium) .Marland Kitchen... Take one tablet by mouth daily at bedtime  CHOL: 191 (10/30/2009)   LDL: 133 (10/30/2009)   HDL: 44.60 (10/30/2009)   TG: 66.0 (10/30/2009)  Problem # 4:  DYSPNEA (ICD-786.05) Likely reactive airway disease.  Continue advair and primary F/U  Her updated medication list for this problem includes:    Aspirin Ec 325 Mg Tbec (Aspirin) .Marland Kitchen... Take one tablet by mouth daily  Patient Instructions: 1)  Your physician recommends that you schedule a follow-up appointment in: 3 MONTHS WITH [name] 2)  You have been referred to DR [facility] ASAP FOR ATRIAL TACH ABLATION

## 2010-10-24 NOTE — Progress Notes (Signed)
Summary: question on procedure  Phone Note Call from Patient Call back at [phone]   Caller: Patient Reason for Call: Talk to Nurse Summary of Call: re ablation on dec the 8th. pt has question re procedure. Initial call taken by: Roe Coombs,  August 27, 2010 11:12 AM  Follow-up for Phone Call        spoke with pt and faxed over information Dennis Bast, RN, BSN  August 27, 2010 2:48 PM

## 2010-10-24 NOTE — Progress Notes (Signed)
Summary: pt want monitor results  Phone Note Call from Patient Call back at Home Phone [phone]   Caller: Patient Reason for Call: Talk to Nurse, Talk to Doctor, Lab or Test Results Summary of Call: pt would like results of her moniter Initial call taken by: Omer Jack,  December 20, 2009 8:38 AM  Follow-up for Phone Call        spoke with pt, monitor shows off and on atrial fib. per dr Eden Emms, pt to start flecainide but pt wants to talk with him before starting any new meds. she will see dr Eden Emms next week. pt understands to go to the er for any prolonged episodes of palpiataions Deliah Goody, RN  December 20, 2009 2:45 PM

## 2010-11-07 NOTE — Progress Notes (Signed)
Summary: rx refill  Phone Note Refill Request Call back at [phone] Message from:  Patient on October 24, 2010 11:27 AM  Refills Requested: Medication #1:  VERAPAMIL HCL CR 240 MG CR-TABS one by mouth daily.  Method Requested: Telephone to Pharmacy Initial call taken by: Roe Coombs,  October 24, 2010 11:27 AM    Prescriptions: VERAPAMIL HCL CR 240 MG CR-TABS (VERAPAMIL HCL) one by mouth daily  #30 x 11   Entered by:   Burnett Kanaris, CNA   Authorized by:   Hillis Range, MD   Signed by:   Burnett Kanaris, CNA on 10/28/2010   Method used:   Electronically to        [facility]* (retail)       [address]       [address] Shipping/mailing       [place], [place]  [address]       Ph: [phone]       Fax: [phone]   RxID:   [id number]

## 2010-11-12 ENCOUNTER — Inpatient Hospital Stay (INDEPENDENT_AMBULATORY_CARE_PROVIDER_SITE_OTHER)
Admission: RE | Admit: 2010-11-12 | Discharge: 2010-11-12 | Disposition: A | Discharge status: Home / Self Care | Payer: Self-pay | Source: Ambulatory Visit | Attending: Family Medicine | Admitting: Family Medicine

## 2010-11-12 DIAGNOSIS — R300 Dysuria: Secondary | ICD-10-CM

## 2010-11-12 LAB — POCT URINALYSIS DIPSTICK
Ketones, ur: NEGATIVE mg/dL
Protein, ur: NEGATIVE mg/dL
Specific Gravity, Urine: 1.01 (ref 1.005–1.030)
pH: 8.5 — ABNORMAL HIGH (ref 5.0–8.0)

## 2010-11-14 LAB — URINE CULTURE: Colony Count: 80000

## 2010-12-23 ENCOUNTER — Other Ambulatory Visit: Payer: Self-pay | Admitting: Cardiovascular Disease

## 2011-01-01 ENCOUNTER — Other Ambulatory Visit: Payer: Self-pay | Admitting: *Deleted

## 2011-01-01 MED ORDER — DILTIAZEM HCL ER COATED BEADS 240 MG PO CP24: 240.0000 mg | ORAL_CAPSULE | Freq: Every day | ORAL | Status: DC

## 2011-01-17 ENCOUNTER — Encounter: Payer: Self-pay | Admitting: Internal Medicine

## 2011-01-20 ENCOUNTER — Ambulatory Visit (INDEPENDENT_AMBULATORY_CARE_PROVIDER_SITE_OTHER): Payer: 59 | Admitting: Internal Medicine

## 2011-01-20 ENCOUNTER — Encounter: Payer: Self-pay | Admitting: Internal Medicine

## 2011-01-20 VITALS — BP 120/80 | HR 64 | Ht 67.0 in | Wt 146.0 lb

## 2011-01-20 DIAGNOSIS — R0602 Shortness of breath: Secondary | ICD-10-CM

## 2011-01-20 DIAGNOSIS — I471 Supraventricular tachycardia: Secondary | ICD-10-CM

## 2011-01-20 DIAGNOSIS — E7800 Pure hypercholesterolemia, unspecified: Secondary | ICD-10-CM

## 2011-01-20 DIAGNOSIS — E782 Mixed hyperlipidemia: Secondary | ICD-10-CM

## 2011-01-20 MED ORDER — DILTIAZEM HCL ER COATED BEADS 240 MG PO CP24: 240.0000 mg | ORAL_CAPSULE | Freq: Every day | ORAL | Status: DC

## 2011-01-20 NOTE — Progress Notes (Signed)
The patient presents today for routine electrophysiology followup.  Since last being seen in our clinic, the patient reports doing very well.  Her palpitations are controlled.  She reports better results with cardizem than verapamil and therefore returned to cardizem.  She reports rare palpitations lasting less than 1 minute typically.  She also reports occasional SOB at rest which she attributes to asthma  Today, she denies symptoms of palpitations, chest pain,orthopnea, PND, lower extremity edema, dizziness, presyncope, syncope, or neurologic sequela.  The patient feels that she is tolerating medications without difficulties and is otherwise without complaint today.   Past Medical History  Diagnosis Date  . SVT (supraventricular tachycardia)     presumed atrial tachycardia not inducible on EPS 12/11  . Hypercholesteremia   . Mitral valve prolapse   . Asthma   . Allergic rhinitis    Past Surgical History  Procedure Date  . Lumbar fusion 2000  . Ep study 12/11    by Fawn Kirk, no inducible arrhythmia    Current Outpatient Prescriptions  Medication Sig Dispense Refill  . aspirin 81 MG tablet Take 81 mg by mouth daily.        . Calcium Carbonate (CALCARB 600) 1500 MG TABS Take 1 tablet by mouth 2 (two) times daily.        . cetirizine (ZYRTEC) 10 MG tablet Take 10 mg by mouth daily.        Marland Kitchen diltiazem (CARDIZEM CD) 240 MG 24 hr capsule Take 1 capsule (240 mg total) by mouth daily.  90 capsule  3  . ergocalciferol (VITAMIN D2) 50000 UNITS capsule Take 50,000 Units by mouth. Every 2 weeks       . Fluticasone-Salmeterol (ADVAIR) 250-50 MCG/DOSE AEPB Inhale 2 puffs into the lungs every 12 (twelve) hours.        . Magnesium 500 MG CAPS Take 1 capsule by mouth daily.        . Omega-3 Fatty Acids (FISH OIL) 1200 MG CAPS Take 2 capsules by mouth daily.        . pravastatin (PRAVACHOL) 40 MG tablet TAKE 1 TABLET BY MOUTH DAILY AT BEDTIME  30 tablet  PRN  . DISCONTD: diltiazem (CARDIZEM CD) 240 MG 24 hr  capsule Take 1 capsule (240 mg total) by mouth daily.  30 capsule  3  . DISCONTD: verapamil (CALAN-SR) 240 MG CR tablet Take 240 mg by mouth at bedtime.          No Known Allergies  History   Social History  . Marital Status: Married    Spouse Name: N/A    Number of Children: 3  . Years of Education: N/A   Occupational History  . RN     case manager for [employer]   Social History Main Topics  . Smoking status: Never Smoker   . Smokeless tobacco: Not on file  . Alcohol Use: No  . Drug Use: No  . Sexually Active: Not on file   Other Topics Concern  . Not on file   Social History Narrative  . No narrative on file    Family History  Problem Relation Age of Onset  . Asthma      family hx of  . Hypertension      family hx of  . Cancer      family hx of   Physical Exam: Filed Vitals:   01/20/11 0926  BP: 120/80  Pulse: 64  Height: 5\' 7"  (1.702 m)  Weight: 146 lb (66.225  kg)    GEN- The patient is well appearing, alert and oriented x 3 today.   Head- normocephalic, atraumatic Eyes-  Sclera clear, conjunctiva pink Ears- hearing intact Oropharynx- clear Neck- supple, no JVP Lymph- no cervical lymphadenopathy Lungs- Clear to ausculation bilaterally, normal work of breathing Heart- Regular rate and rhythm, no murmurs, rubs or gallops, PMI not laterally displaced GI- soft, NT, ND, + BS Extremities- no clubbing, cyanosis, or edema MS- no significant deformity or atrophy Skin- no rash or lesion Psych- euthymic mood, full affect Neuro- strength and sensation are intact  ekg- sinus rhythm 64 bpm, otherwise normal ekg  Assessment and Plan:

## 2011-01-20 NOTE — Assessment & Plan Note (Signed)
Stable She will follow-up with PCP for possible PFTs if worsens.  Recent echo reveals preserved EF without significant abnormality.

## 2011-01-20 NOTE — Patient Instructions (Addendum)
Your physician wants you to follow-up in: 12 months. You will receive a reminder letter in the mail two months in advance. If you don't receive a letter, please call our office to schedule the follow-up appointment.  Your physician recommends that you return for lab work on: 01/22/2011 @ 9:30AM.

## 2011-01-20 NOTE — Assessment & Plan Note (Signed)
We will check fasting lipids and LFTs

## 2011-01-20 NOTE — Assessment & Plan Note (Signed)
Controlled with cardizem No changes She wishes to avoid flecainide/ AAD unless her palpitations worsen.

## 2011-01-22 ENCOUNTER — Other Ambulatory Visit (INDEPENDENT_AMBULATORY_CARE_PROVIDER_SITE_OTHER): Payer: 59 | Admitting: *Deleted

## 2011-01-22 DIAGNOSIS — E782 Mixed hyperlipidemia: Secondary | ICD-10-CM

## 2011-01-22 LAB — HEPATIC FUNCTION PANEL
ALT: 15 U/L (ref 0–35)
Alkaline Phosphatase: 80 U/L (ref 39–117)
Bilirubin, Direct: 0.1 mg/dL (ref 0.0–0.3)
Total Protein: 7.1 g/dL (ref 6.0–8.3)

## 2011-01-22 LAB — LIPID PANEL: Cholesterol: 181 mg/dL (ref 0–200)

## 2011-07-14 ENCOUNTER — Inpatient Hospital Stay (INDEPENDENT_AMBULATORY_CARE_PROVIDER_SITE_OTHER)
Admission: RE | Admit: 2011-07-14 | Discharge: 2011-07-14 | Disposition: A | Discharge status: Home / Self Care | Payer: 59 | Source: Ambulatory Visit | Attending: Emergency Medicine | Admitting: Emergency Medicine

## 2011-07-14 DIAGNOSIS — N390 Urinary tract infection, site not specified: Secondary | ICD-10-CM

## 2011-07-14 LAB — POCT URINALYSIS DIP (DEVICE)
Nitrite: NEGATIVE
Protein, ur: NEGATIVE mg/dL
pH: 7 (ref 5.0–8.0)

## 2011-07-31 ENCOUNTER — Encounter: Payer: Self-pay | Admitting: Internal Medicine

## 2011-08-01 ENCOUNTER — Ambulatory Visit (INDEPENDENT_AMBULATORY_CARE_PROVIDER_SITE_OTHER): Payer: 59 | Admitting: Internal Medicine

## 2011-08-01 ENCOUNTER — Encounter: Payer: Self-pay | Admitting: Internal Medicine

## 2011-08-01 VITALS — BP 118/74 | HR 63 | Temp 98.2°F | Ht 67.0 in | Wt 142.8 lb

## 2011-08-01 DIAGNOSIS — R0602 Shortness of breath: Secondary | ICD-10-CM

## 2011-08-01 MED ORDER — MOMETASONE FURO-FORMOTEROL FUM 200-5 MCG/ACT IN AERO: INHALATION_SPRAY | RESPIRATORY_TRACT | Status: DC

## 2011-08-01 NOTE — Progress Notes (Signed)
  Subjective:    Patient ID: Rosealee Albee, female    DOB: 1956/01/27, 55 y.o.   MRN: [id number]  HPI  5 yowf never smoker asthma dx in childhood never took any medications and seemed better enough to point where could play competitive basketball (but not outdoor sports)  then around 2000 started having  on maint inhalers advair and seemed to help some doubly didn't make any difference referred for persistent sob to pulmonary clinic by Dr Alred/ Doristine Counter Nov 2012 with new dx of paf but no assoc with sob.  08/01/2011 1st Pulmonary evaluation, prior allergy in 1987 with shots x one year not benefit,  Last 2 years has different pattern of sob occurs at rest but also sob with activity reproducibly with activity including some outdoor walking.  The resting symptoms occur daily x 5 min resolve spontaneously, assoc with loss of voice,  assoc with sense of thoat congestion but no excess mucus production or obvious reflux or sinus co's  Sleeping ok without nocturnal  or early am exacerbation  of respiratory  c/o's or need for noct saba. Also denies any obvious fluctuation of symptoms with weather or environmental changes or other aggravating or alleviating factors except as outlined above       Review of Systems  Constitutional: Negative for fever, chills and unexpected weight change.  HENT: Negative for ear pain, nosebleeds, congestion, sore throat, rhinorrhea, sneezing, trouble swallowing, dental problem, voice change, postnasal drip and sinus pressure.   Eyes: Negative for visual disturbance.  Respiratory: Positive for cough and shortness of breath. Negative for choking.   Cardiovascular: Negative for chest pain and leg swelling.  Gastrointestinal: Negative for vomiting, abdominal pain and diarrhea.  Genitourinary: Negative for difficulty urinating.  Musculoskeletal: Positive for arthralgias.  Skin: Positive for rash.  Neurological: Negative for tremors, syncope and headaches.  Hematological: Does not  bruise/bleed easily.       Objective:   Physical Exam  Pleasant amb wf nad Wt 142 08/01/11 HEENT: nl dentition, turbinates, and orophanx. Nl external ear canals without cough reflex   NECK :  without JVD/Nodes/TM/ nl carotid upstrokes bilaterally   LUNGS: no acc muscle use, clear to A and P bilaterally without cough on insp or exp maneuvers   CV:  RRR  no s3 or murmur or increase in P2, no edema   ABD:  soft and nontender with nl excursion in the supine position. No bruits or organomegaly, bowel sounds nl  MS:  warm without deformities, calf tenderness, cyanosis or clubbing  SKIN: warm and dry without lesions    NEURO:  alert, approp, no deficits        Assessment & Plan:

## 2011-08-01 NOTE — Patient Instructions (Signed)
GERD (REFLUX)  is an extremely common cause of respiratory symptoms, many times with no significant heartburn at all.    It can be treated with medication, but also with lifestyle changes including avoidance of late meals, excessive alcohol, smoking cessation, and avoid fatty foods, chocolate, peppermint, colas, red wine, and acidic juices such as orange juice.  NO MINT OR MENTHOL PRODUCTS SO NO COUGH DROPS  USE SUGARLESS CANDY INSTEAD (jolley ranchers or Stover's)  NO OIL BASED VITAMINS - use powdered substitutes.  If not improving I recommend prilosec 20 mg  30 min before breakfast     Stop advair and start dulera 200 Take 2 puffs first thing in am and then another 2 puffs about 12 hours later.   Work on inhaler technique:  relax and gently blow all the way out then take a nice smooth deep breath back in, triggering the inhaler at same time you start breathing in.  Hold for up to 5 seconds if you can.  Rinse and gargle with water when done   If your mouth or throat starts to bother you,   I suggest you time the inhaler to your dental care and after using the inhaler(s) brush teeth and tongue with a baking soda containing toothpaste and when you rinse this out, gargle with it first to see if this helps your mouth and throat.     Please schedule a follow up office visit in 4 weeks, sooner if needed with PFT's

## 2011-08-01 NOTE — Assessment & Plan Note (Addendum)
Symptoms are markedly disproportionate to objective findings and not clear this is a lung problem but pt does appear to have difficult airway management issues.   DDX of  difficult airways managment all start with A and  include Adherence, Ace Inhibitors, Acid Reflux, Active Sinus Disease, Alpha 1 Antitripsin deficiency, Anxiety masquerading as Airways dz,  ABPA,  allergy(esp in young), Aspiration (esp in elderly), Adverse effects of DPI,  Active smokers, plus two Bs  = Bronchiectasis and Beta blocker use..and one C= CHF  The proper method of use, as well as anticipated side effects, of this metered-dose inhaler are discussed and demonstrated to the patient. Improved to 75% with coaching  ? Acid reflux > ppi/ diet reviewed  ? Adverse effect from dpi > try dulera 200 hfa   ? Anxiety, esp re resting symptoms that resolve spontaneously and not while sleeping > dx of exclusion  Return in 4 weeks with pfts

## 2011-08-02 ENCOUNTER — Encounter: Payer: Self-pay | Admitting: Internal Medicine

## 2011-09-04 ENCOUNTER — Ambulatory Visit (INDEPENDENT_AMBULATORY_CARE_PROVIDER_SITE_OTHER): Payer: 59 | Admitting: Internal Medicine

## 2011-09-04 ENCOUNTER — Encounter: Payer: Self-pay | Admitting: Internal Medicine

## 2011-09-04 DIAGNOSIS — J45909 Unspecified asthma, uncomplicated: Secondary | ICD-10-CM

## 2011-09-04 DIAGNOSIS — R0602 Shortness of breath: Secondary | ICD-10-CM

## 2011-09-04 LAB — PULMONARY FUNCTION TEST

## 2011-09-04 NOTE — Progress Notes (Signed)
  Subjective:    Patient ID: Teresa Fitzgerald, female    DOB: 1956-07-28, 55 y.o.   MRN: [id number]  HPI  64 yowf never smoker asthma dx in childhood never took any medications and seemed better enough to point where could play competitive basketball (but not outdoor sports)  then around 2000 started   on maint inhalers advair and seemed to help some doubling dose didn't make any difference referred for persistent sob to pulmonary clinic by Dr Alred/ Doristine Counter Nov 2012 with new dx of paf but no assoc with sob.  08/01/2011 1st Pulmonary evaluation, prior allergy in 1987 with shots x one year>  no  benefit,  Last 2 years has different pattern of sob occurs at rest but also sob with activity reproducibly with activity including some outdoor walking.  The resting symptoms occur daily x 5 min resolve spontaneously, assoc with loss of voice,  assoc with sense of thoat congestion but no excess mucus production or obvious reflux or sinus co's rec GERD diet reviewed .  If not improving I recommend prilosec 20 mg  30 min before breakfast  Stop advair and start dulera 200 Take 2 puffs first thing in am and then another 2 puffs about 12 hours later.  Work on inhaler technique  09/04/2011 f/u ov/Teresa Fitzgerald cc much better, no cough or sob, pft's wnl.  Sleeping ok without nocturnal  or early am exacerbation  of respiratory  c/o's or need for noct saba. Also denies any obvious fluctuation of symptoms with weather or environmental changes or other aggravating or alleviating factors except as outlined above   ROS  At present neg for  any significant sore throat, dysphagia, itching, sneezing,  nasal congestion or excess/ purulent secretions,  fever, chills, sweats, unintended wt loss, pleuritic or exertional cp, hempoptysis, orthopnea pnd or leg swelling.  Also denies presyncope, palpitations, heartburn, abdominal pain, nausea, vomiting, diarrhea  or change in bowel or urinary habits, dysuria,hematuria,  rash, arthralgias, visual  complaints, headache, numbness weakness or ataxia.               Objective:   Physical Exam  Pleasant amb wf nad  Wt 142 08/01/11 > 09/04/2011  140  HEENT: nl dentition, turbinates, and orophanx. Nl external ear canals without cough reflex   NECK :  without JVD/Nodes/TM/ nl carotid upstrokes bilaterally   LUNGS: no acc muscle use, clear to A and P bilaterally without cough on insp or exp maneuvers   CV:  RRR  no s3 or murmur or increase in P2, no edema   ABD:  soft and nontender with nl excursion in the supine position. No bruits or organomegaly, bowel sounds nl  MS:  warm without deformities, calf tenderness, cyanosis or clubbing  SKIN: warm and dry without lesions    NEURO:  alert, approp, no deficits        Assessment & Plan:

## 2011-09-04 NOTE — Patient Instructions (Signed)
Ok to just use the dulera 200 in am and leave off the pm dose to see if notice any difference.  If you do great on the dulera in am only x 3 months return to clinic to consider step down therapy.

## 2011-09-04 NOTE — Assessment & Plan Note (Signed)
At this point 100% better with nl pft's so All goals of chronic asthma control met including optimal function and elimination of symptoms with minimal need for rescue therapy.  Contingencies discussed in full including contacting this office immediately if not controlling the symptoms using the rule of two's.     The longer term issue is whether really needs combination therapy or wouldn't do just as well with qvar and prn albuterol. The nice feature of dulera and symbicort is immediate resolution of symptoms should she forget a dose or try titrating down (which she's like to do) and gets it wrong.    Each maintenance medication was reviewed in detail including most importantly the difference between maintenance and as needed and under what circumstances the prns are to be used.  Please see instructions for details which were reviewed in writing and the patient given a copy.

## 2011-09-04 NOTE — Progress Notes (Signed)
PFT done today. 

## 2012-02-26 ENCOUNTER — Other Ambulatory Visit: Payer: Self-pay | Admitting: Cardiovascular Disease

## 2012-03-08 ENCOUNTER — Encounter: Payer: Self-pay | Admitting: Internal Medicine

## 2012-03-08 ENCOUNTER — Ambulatory Visit (INDEPENDENT_AMBULATORY_CARE_PROVIDER_SITE_OTHER): Payer: 59 | Admitting: Internal Medicine

## 2012-03-08 VITALS — BP 120/68 | HR 63 | Temp 97.7°F | Ht 67.0 in | Wt 142.6 lb

## 2012-03-08 DIAGNOSIS — J45909 Unspecified asthma, uncomplicated: Secondary | ICD-10-CM

## 2012-03-08 NOTE — Progress Notes (Signed)
  Subjective:    Patient ID: Teresa Fitzgerald, female    DOB: June 16, 1956   MRN: [id number]  HPI  85 yowf never smoker asthma dx in childhood never took any medications and seemed better enough to point where could play competitive basketball (but not outdoor sports)  then around 2000 started   on maint inhalers advair and seemed to help some doubling dose didn't make any difference referred for persistent sob to pulmonary clinic by Dr Alred/ Doristine Counter Nov 2012 with new dx of paf but no assoc with sob.  08/01/2011 1st Pulmonary evaluation, prior allergy in 1987 with shots x one year>  no  benefit,  Last 2 years has different pattern of sob occurs at rest but also sob with activity reproducibly with activity including some outdoor walking.  The resting symptoms occur daily x 5 min resolve spontaneously, assoc with loss of voice,  assoc with sense of thoat congestion but no excess mucus production or obvious reflux or sinus co's rec GERD diet reviewed .  If not improving I recommend prilosec 20 mg  30 min before breakfast  Stop advair and start dulera 200 Take 2 puffs first thing in am and then another 2 puffs about 12 hours later.  Work on inhaler technique  09/04/2011 f/u ov/Asriel Westrup cc much better, no cough or sob, pft's wnl. rec Ok to just use the dulera 200 in am and leave off the pm dose to see if notice any difference. If you do great on the dulera in am only x 3 months return to clinic to consider step down therapy.    03/08/2012 f/u ov/Zeno Hickel cc breathing no trouble at all not always using am dose of dulera, never the pm dose, never using rescue inhaler, still having occ palpitations followed by Alred considering fleconide rx but not sotalol.  No cough overt hb or sinus symptoms.  Sleeping ok without nocturnal  or early am exacerbation  of respiratory  c/o's or need for noct saba. Also denies any obvious fluctuation of symptoms with weather or environmental changes or other aggravating or alleviating  factors except as outlined above   ROS  At present neg for  any significant sore throat, dysphagia, itching, sneezing,  nasal congestion or excess/ purulent secretions,  fever, chills, sweats, unintended wt loss, pleuritic or exertional cp, hempoptysis, orthopnea pnd or leg swelling.  Also denies abdominal pain, nausea, vomiting, diarrhea  or change in bowel or urinary habits, dysuria,hematuria,  rash, arthralgias, visual complaints, headache, numbness weakness or ataxia.               Objective:   Physical Exam  Pleasant amb wf nad  Wt 142 08/01/11 > 09/04/2011  140 > 03/08/2012  142  HEENT: nl dentition, turbinates, and orophanx. Nl external ear canals without cough reflex   NECK :  without JVD/Nodes/TM/ nl carotid upstrokes bilaterally   LUNGS: no acc muscle use, clear to A and P bilaterally without cough on insp or exp maneuvers   CV:  RRR  no s3 or murmur or increase in P2, no edema   ABD:  soft and nontender with nl excursion in the supine position. No bruits or organomegaly, bowel sounds nl  MS:  warm without deformities, calf tenderness, cyanosis or clubbing           Assessment & Plan:

## 2012-03-08 NOTE — Patient Instructions (Addendum)
Continue to use Dulera 1-2 puffs every 12 hours if having all allergy or asthma symptoms with exertion or not - it will work w/in 5 minutes  If you need a beta blocker I strongly recommend bisoprolol the most Beta selective available.   If you are satisfied with your treatment plan let your doctor know and he/she can either refill your medications or you can return here when your prescription runs out.     If in any way you are not 100% satisfied,  please tell us.  If 100% better, tell your friends!

## 2012-03-08 NOTE — Assessment & Plan Note (Signed)
-   HFA 75% 08/01/2011    - PFT's nl 09/04/2011   I had an extended discussion with the patient today lasting 15 to 20 minutes of a 25 minute visit on the following issues:   Adequate control on present rx, reviewed options - she really isn't consistent enough with inhalers to use ICS as does not have daily symptoms so best to just use dulera 1-2 bid prn as long as mdi use is effective consistently when she does use it.  The proper method of use, as well as anticipated side effects, of a metered-dose inhaler are discussed and demonstrated to the patient. Improved effectiveness after extensive coaching during this visit to a level of approximately  90%

## 2012-03-09 ENCOUNTER — Other Ambulatory Visit: Payer: Self-pay | Admitting: *Deleted

## 2012-03-09 MED ORDER — DILTIAZEM HCL ER COATED BEADS 240 MG PO CP24: 240.0000 mg | ORAL_CAPSULE | Freq: Every day | ORAL | Status: DC

## 2012-04-05 ENCOUNTER — Encounter: Payer: Self-pay | Admitting: Internal Medicine

## 2012-04-05 ENCOUNTER — Ambulatory Visit (INDEPENDENT_AMBULATORY_CARE_PROVIDER_SITE_OTHER): Payer: 59 | Admitting: Internal Medicine

## 2012-04-05 VITALS — BP 138/70 | HR 70 | Resp 18 | Ht 67.0 in | Wt 140.0 lb

## 2012-04-05 DIAGNOSIS — I471 Supraventricular tachycardia, unspecified: Secondary | ICD-10-CM

## 2012-04-05 DIAGNOSIS — I498 Other specified cardiac arrhythmias: Secondary | ICD-10-CM

## 2012-04-05 NOTE — Progress Notes (Signed)
PCP: Delorse Lek, MD  The patient presents today for routine electrophysiology followup.  Since last being seen in our clinic, the patient reports doing very well.  She continues to have occasional palpitations but finds that these last less than 1 minute typically.  She has noticed mild pedal edema.   Today, she denies symptoms of palpitations, chest pain,orthopnea, PND, dizziness, presyncope, syncope, or neurologic sequela.  The patient feels that she is tolerating medications without difficulties and is otherwise without complaint today.   Past Medical History  Diagnosis Date  . SVT (supraventricular tachycardia)     presumed atrial tachycardia not inducible on EPS 12/11  . Hypercholesteremia   . Mitral valve prolapse   . Asthma   . Allergic rhinitis    Past Surgical History  Procedure Date  . Lumbar fusion 2000  . Ep study 12/11    by JA, no inducible arrhythmia  . Tubal ligation 1988    Current Outpatient Prescriptions  Medication Sig Dispense Refill  . aspirin 81 MG tablet Take 81 mg by mouth daily.        . Calcium Carbonate (CALCARB 600) 1500 MG TABS Take 1 tablet by mouth 2 (two) times daily.        . cetirizine (ZYRTEC) 10 MG tablet Take 10 mg by mouth daily.        . Coenzyme Q10 (COQ10 PO) Take 1 tablet by mouth daily.        Marland Kitchen diltiazem (CARDIZEM CD) 240 MG 24 hr capsule Take 1 capsule (240 mg total) by mouth daily.  90 capsule  3  . ergocalciferol (VITAMIN D2) 50000 UNITS capsule Take 50,000 Units by mouth. Every 2 weeks       . Magnesium 500 MG CAPS Take 1 capsule by mouth daily.        . Mometasone Furo-Formoterol Fum 200-5 MCG/ACT AERO Take 2 puffs first thing in the am      . pravastatin (PRAVACHOL) 40 MG tablet TAKE 1 TABLET BY MOUTH DAILY AT BEDTIME  30 tablet  PRN    Allergies  Allergen Reactions  . Oatmeal   . Other     ALL Tree nuts.    History   Social History  . Marital Status: Married    Spouse Name: N/A    Number of Children: 3  . Years of  Education: N/A   Occupational History  . 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 Smoking status: Never Smoker   . Smokeless tobacco: Never Used  . Alcohol Use: 3.5 oz/week    7 drink(s) per week     2 glasses of wine daily  . Drug Use: No  . Sexually Active: Not on file   Other Topics Concern  . Not on file   Social History Narrative  . No narrative on file    Family History  Problem Relation Age of Onset  . Asthma Father   . Asthma Paternal Grandfather   . Lung cancer Mother     smoked  . Allergies Father    Physical Exam: Filed Vitals:   04/05/12 1511  BP: 138/70  Pulse: 70  Resp: 18  Height: 5\' 7"  (1.702 m)  Weight: 140 lb (63.504 kg)  SpO2: 96%    GEN- The patient is well appearing, alert and oriented x 3 today.   Head- normocephalic, atraumatic Eyes-  Sclera clear, conjunctiva pink Ears- hearing intact Oropharynx- clear Neck-  supple, no JVP Lymph- no cervical lymphadenopathy Lungs- Clear to ausculation bilaterally, normal work of breathing Heart- Regular rate and rhythm, no murmurs, rubs or gallops, PMI not laterally displaced GI- soft, NT, ND, + BS Extremities- no clubbing, cyanosis, trivial edema  ekg- sinus rhythm 74 bpm, otherwise normal ekg  Assessment and Plan:

## 2012-04-05 NOTE — Assessment & Plan Note (Signed)
She has short episodes of atrial tachycardia.  She appears to be doing well.  Her tachycardia was not inducible at prior EP study and therefore was not amenable to ablation.  She wishes to avoid antiarrhythmic drugs. She has mild swelling which may be related to diltiazem.  I have encouraged her to try stopping diltiazem to see if swelling improves and to see if tachycardia worsens. No other changes are planned at this point.  If her tachycardia worsens, then she may consider an antiarrhythmic medicine (such as flecainide) at that time.

## 2012-04-05 NOTE — Patient Instructions (Addendum)
Your physician wants you to follow-up in: 12 months wit Dr Jacquiline Doe will receive a reminder letter in the mail two months in advance. If you don't receive a letter, please call our office to schedule the follow-up appointment.

## 2012-10-14 ENCOUNTER — Other Ambulatory Visit: Payer: Self-pay | Admitting: Obstetrics and Gynecology

## 2012-10-25 ENCOUNTER — Ambulatory Visit
Admission: RE | Admit: 2012-10-25 | Discharge: 2012-10-25 | Disposition: A | Discharge status: Home / Self Care | Payer: 59 | Source: Ambulatory Visit | Attending: Obstetrics and Gynecology | Admitting: Obstetrics and Gynecology

## 2012-11-06 ENCOUNTER — Other Ambulatory Visit: Payer: Self-pay

## 2013-03-22 ENCOUNTER — Other Ambulatory Visit: Payer: Self-pay | Admitting: Internal Medicine

## 2013-04-21 ENCOUNTER — Encounter: Payer: Self-pay | Admitting: Internal Medicine

## 2013-04-21 ENCOUNTER — Ambulatory Visit (INDEPENDENT_AMBULATORY_CARE_PROVIDER_SITE_OTHER): Payer: 59 | Admitting: Internal Medicine

## 2013-04-21 VITALS — BP 124/76 | HR 64 | Ht 67.0 in | Wt 140.0 lb

## 2013-04-21 DIAGNOSIS — R002 Palpitations: Secondary | ICD-10-CM

## 2013-04-21 DIAGNOSIS — R079 Chest pain, unspecified: Secondary | ICD-10-CM

## 2013-04-21 DIAGNOSIS — E7800 Pure hypercholesterolemia, unspecified: Secondary | ICD-10-CM

## 2013-04-21 NOTE — Progress Notes (Signed)
PCP: Delorse Lek, MD  The patient presents today for routine electrophysiology followup.  Since last being seen in our clinic, the patient reports doing very well.  Her swelling has resolved with regular exercise. Her SVT is well controlled with cardizem.  Today, she denies symptoms of palpitations, chest pain,orthopnea, PND, dizziness, presyncope, syncope, or neurologic sequela.  The patient feels that she is tolerating medications without difficulties and is otherwise without complaint today.   Past Medical History  Diagnosis Date  . SVT (supraventricular tachycardia)     presumed atrial tachycardia not inducible on EPS 12/11  . Hypercholesteremia   . Mitral valve prolapse   . Asthma   . Allergic rhinitis    Past Surgical History  Procedure Laterality Date  . Lumbar fusion  2000  . Ep study  12/11    by JA, no inducible arrhythmia  . Tubal ligation  1988    Current Outpatient Prescriptions  Medication Sig Dispense Refill  . aspirin 81 MG tablet Take 81 mg by mouth daily.        . Calcium Carbonate (CALCARB 600) 1500 MG TABS Take 1 tablet by mouth 2 (two) times daily.        . cetirizine (ZYRTEC) 10 MG tablet Take 10 mg by mouth daily.        . Coenzyme Q10 (COQ10 PO) Take 1 tablet by mouth daily.        Marland Kitchen diltiazem (CARDIZEM CD) 240 MG 24 hr capsule TAKE 1 CAPSULE BY MOUTH DAILY  90 capsule  3  . ergocalciferol (VITAMIN D2) 50000 UNITS capsule Take 50,000 Units by mouth. Every 2 weeks       . Magnesium 500 MG CAPS Take 1 capsule by mouth daily.        . Mometasone Furo-Formoterol Fum 200-5 MCG/ACT AERO Take 2 puffs first thing in the am      . pravastatin (PRAVACHOL) 40 MG tablet TAKE 1 TABLET BY MOUTH DAILY AT BEDTIME  30 tablet  PRN   No current facility-administered medications for this visit.    Allergies  Allergen Reactions  . Oatmeal   . Other     ALL Tree nuts.    History   Social History  . Marital Status: Married    Spouse Name: N/A    Number of  Children: 3  . Years of Education: N/A   Occupational History  . 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 Smoking status: Never Smoker   . Smokeless tobacco: Never Used  . Alcohol Use: 3.5 oz/week    7 drink(s) per week     Comment: 2 glasses of wine daily  . Drug Use: No  . Sexually Active: Not on file   Other Topics Concern  . Not on file   Social History Narrative  . No narrative on file    Family History  Problem Relation Age of Onset  . Asthma Father   . Asthma Paternal Grandfather   . Lung cancer Mother     smoked  . Allergies Father    Physical Exam: Filed Vitals:   04/21/13 1631  BP: 124/76  Pulse: 64  Height: 5\' 7"  (1.702 m)  Weight: 140 lb (63.504 kg)  SpO2: 98%    GEN- The patient is well appearing, alert and oriented x 3 today.   Head- normocephalic, atraumatic Eyes-  Sclera clear, conjunctiva pink Ears- hearing intact Oropharynx- clear Neck- supple, no  JVP Lymph- no cervical lymphadenopathy Lungs- Clear to ausculation bilaterally, normal work of breathing Heart- Regular rate and rhythm, no murmurs, rubs or gallops, PMI not laterally displaced GI- soft, NT, ND, + BS Extremities- no clubbing, cyanosis, trivial edema  ekg- sinus rhythm 61 bpm, otherwise normal ekg  Assessment and Plan:  1. SVT Well controlled No changes  2. Mild edema previously BMET   3. HL Fasting lipids and LFTs  Return in 1 year

## 2013-04-21 NOTE — Progress Notes (Deleted)
PCP:  Delorse Lek, MD  The patient presents today for routine electrophysiology followup.  Since last being seen in our clinic, the patient reports doing very well.    She has a longstanding history of palpitations and atrial tachycardia that was not inducible at EP study in 2011.  She has preferred to not take AAD in the past.   Today, she denies symptoms of palpitations, chest pain, shortness of breath, orthopnea, PND, lower extremity edema, dizziness, presyncope, syncope, or neurologic sequela.  The patient feels that she is tolerating medications without difficulties and is otherwise without complaint today.   Past Medical History  Diagnosis Date  . SVT (supraventricular tachycardia)     presumed atrial tachycardia not inducible on EPS 12/11  . Hypercholesteremia   . Mitral valve prolapse   . Asthma   . Allergic rhinitis    Past Surgical History  Procedure Laterality Date  . Lumbar fusion  2000  . Ep study  12/11    by JA, no inducible arrhythmia  . Tubal ligation  1988    Current Outpatient Prescriptions  Medication Sig Dispense Refill  . aspirin 81 MG tablet Take 81 mg by mouth daily.        . Calcium Carbonate (CALCARB 600) 1500 MG TABS Take 1 tablet by mouth 2 (two) times daily.        . cetirizine (ZYRTEC) 10 MG tablet Take 10 mg by mouth daily.        . Coenzyme Q10 (COQ10 PO) Take 1 tablet by mouth daily.        Marland Kitchen diltiazem (CARDIZEM CD) 240 MG 24 hr capsule TAKE 1 CAPSULE BY MOUTH DAILY  90 capsule  3  . ergocalciferol (VITAMIN D2) 50000 UNITS capsule Take 50,000 Units by mouth. Every 2 weeks       . Magnesium 500 MG CAPS Take 1 capsule by mouth daily.        . Mometasone Furo-Formoterol Fum 200-5 MCG/ACT AERO Take 2 puffs first thing in the am      . pravastatin (PRAVACHOL) 40 MG tablet TAKE 1 TABLET BY MOUTH DAILY AT BEDTIME  30 tablet  PRN   No current facility-administered medications for this visit.    Allergies  Allergen Reactions  . Oatmeal   .  Other     ALL Tree nuts.    History   Social History  . Marital Status: Married    Spouse Name: N/A    Number of Children: 3  . Years of Education: N/A   Occupational History  . 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 Smoking status: Never Smoker   . Smokeless tobacco: Never Used  . Alcohol Use: 3.5 oz/week    7 drink(s) per week     Comment: 2 glasses of wine daily  . Drug Use: No  . Sexually Active: Not on file   Other Topics Concern  . Not on file   Social History Narrative  . No narrative on file    Family History  Problem Relation Age of Onset  . Asthma Father   . Asthma Paternal Grandfather   . Lung cancer Mother     smoked  . Allergies Father     ROS-  All systems are reviewed and are negative except as outlined in the HPI above  Physical Exam: Filed Vitals:   04/21/13 1631  BP: 124/76  Pulse: 64  Height:  5\' 7"  (1.702 m)  Weight: 140 lb (63.504 kg)  SpO2: 98%    GEN- The patient is well appearing, alert and oriented x 3 today.   Head- normocephalic, atraumatic Eyes-  Sclera clear, conjunctiva pink Ears- hearing intact Oropharynx- clear Neck- supple, no JVP Lymph- no cervical lymphadenopathy Lungs- Clear to ausculation bilaterally, normal work of breathing Heart- Regular rate and rhythm, no murmurs, rubs or gallops, PMI not laterally displaced GI- soft, NT, ND, + BS Extremities- no clubbing, cyanosis, or edema MS- no significant deformity or atrophy Skin- no rash or lesion Psych- euthymic mood, full affect Neuro- strength and sensation are intact  Assessment and Plan:

## 2013-04-21 NOTE — Patient Instructions (Addendum)
Your physician wants you to follow-up in: 12 months with Dr Jacquiline Doe will receive a reminder letter in the mail two months in advance. If you don't receive a letter, please call our office to schedule the follow-up appointment.  Your physician recommends that you return for lab work tomorrow: lipid/liver/BMP

## 2013-04-22 ENCOUNTER — Other Ambulatory Visit (INDEPENDENT_AMBULATORY_CARE_PROVIDER_SITE_OTHER): Payer: 59

## 2013-04-22 DIAGNOSIS — E7800 Pure hypercholesterolemia, unspecified: Secondary | ICD-10-CM

## 2013-04-22 LAB — BASIC METABOLIC PANEL
BUN: 10 mg/dL (ref 6–23)
Chloride: 107 mEq/L (ref 96–112)
Creatinine, Ser: 0.6 mg/dL (ref 0.4–1.2)
Glucose, Bld: 91 mg/dL (ref 70–99)
Potassium: 4.2 mEq/L (ref 3.5–5.1)

## 2013-04-22 LAB — LIPID PANEL
LDL Cholesterol: 118 mg/dL — ABNORMAL HIGH (ref 0–99)
Total CHOL/HDL Ratio: 4
VLDL: 14.8 mg/dL (ref 0.0–40.0)

## 2013-04-22 LAB — HEPATIC FUNCTION PANEL
Alkaline Phosphatase: 81 U/L (ref 39–117)
Bilirubin, Direct: 0 mg/dL (ref 0.0–0.3)
Total Bilirubin: 0.7 mg/dL (ref 0.3–1.2)

## 2013-05-06 ENCOUNTER — Other Ambulatory Visit: Payer: Self-pay

## 2013-05-06 MED ORDER — PRAVASTATIN SODIUM 40 MG PO TABS: ORAL_TABLET | ORAL | Status: DC

## 2013-06-17 ENCOUNTER — Encounter: Payer: Self-pay | Admitting: Obstetrics & Gynecology

## 2013-07-28 ENCOUNTER — Other Ambulatory Visit: Payer: Self-pay

## 2013-12-29 ENCOUNTER — Other Ambulatory Visit: Payer: Self-pay | Admitting: Obstetrics and Gynecology

## 2013-12-29 ENCOUNTER — Other Ambulatory Visit: Payer: Self-pay | Admitting: Obstetrics & Gynecology

## 2013-12-29 ENCOUNTER — Other Ambulatory Visit: Payer: Self-pay

## 2013-12-29 DIAGNOSIS — Z1231 Encounter for screening mammogram for malignant neoplasm of breast: Secondary | ICD-10-CM

## 2014-01-04 ENCOUNTER — Ambulatory Visit: Payer: Self-pay | Admitting: Obstetrics & Gynecology

## 2014-01-05 ENCOUNTER — Ambulatory Visit
Admission: RE | Admit: 2014-01-05 | Discharge: 2014-01-05 | Disposition: A | Discharge status: Home / Self Care | Payer: 59 | Source: Ambulatory Visit

## 2014-01-05 DIAGNOSIS — Z1231 Encounter for screening mammogram for malignant neoplasm of breast: Secondary | ICD-10-CM

## 2014-01-06 ENCOUNTER — Encounter: Payer: Self-pay | Admitting: Obstetrics & Gynecology

## 2014-01-06 ENCOUNTER — Ambulatory Visit (INDEPENDENT_AMBULATORY_CARE_PROVIDER_SITE_OTHER): Payer: 59 | Admitting: Obstetrics & Gynecology

## 2014-01-06 VITALS — BP 118/64 | HR 60 | Resp 20 | Ht 66.75 in | Wt 145.2 lb

## 2014-01-06 DIAGNOSIS — Z0000 Encounter for general adult medical examination without abnormal findings: Secondary | ICD-10-CM

## 2014-01-06 DIAGNOSIS — Z01419 Encounter for gynecological examination (general) (routine) without abnormal findings: Secondary | ICD-10-CM

## 2014-01-06 DIAGNOSIS — Z124 Encounter for screening for malignant neoplasm of cervix: Secondary | ICD-10-CM

## 2014-01-06 LAB — POCT URINALYSIS DIPSTICK
BILIRUBIN UA: NEGATIVE
Blood, UA: NEGATIVE
GLUCOSE UA: NEGATIVE
Ketones, UA: NEGATIVE
NITRITE UA: NEGATIVE
Protein, UA: NEGATIVE
UROBILINOGEN UA: NEGATIVE
pH, UA: 8.5

## 2014-01-06 LAB — HEMOGLOBIN, FINGERSTICK: Hemoglobin, fingerstick: 13.2 g/dL (ref 12.0–16.0)

## 2014-01-06 NOTE — Patient Instructions (Signed)

## 2014-01-06 NOTE — Progress Notes (Signed)
58 y.o. G3P3 MarriedCaucasianF here for annual exam.  Doing well.  No vaginal bleeding.    Daughter expecting another child this year.  She has a son.  Patient's last menstrual period was 09/22/2008.          Sexually active: yes  The current method of family planning is tubal ligation.    Exercising: yes  walking and yoga Smoker:  no  Health Maintenance: Pap:  10/13/11 WNL/negative HR HPV History of abnormal Pap:  yes MMG:  01/05/14-no results  Colonoscopy:  12/10-repeat in 5 years.  Pt states she will decline another colonoscopy until 10 years. BMD:   12/11-osteopenia, -1.4/-0.2 TDaP:  2009 Screening Labs: 8/14, Hb today: 13.2, Urine today: WBC-trace, PH-8.5   reports that she has never smoked. She has never used smokeless tobacco. She reports that she drinks about 7 ounces of alcohol per week. She reports that she does not use illicit drugs.  Past Medical History  Diagnosis Date  . SVT (supraventricular tachycardia)     presumed atrial tachycardia not inducible on EPS 12/11  . Hypercholesteremia   . Mitral valve prolapse   . Asthma   . Allergic rhinitis   . Osteopenia     Past Surgical History  Procedure Laterality Date  . Lumbar fusion  2000  . Ep study  12/11    by JA, no inducible arrhythmia  . Tubal ligation  1988    Current Outpatient Prescriptions  Medication Sig Dispense Refill  . aspirin 81 MG tablet Take 81 mg by mouth daily.        . Calcium Carbonate (CALCARB 600) 1500 MG TABS Take 1 tablet by mouth 2 (two) times daily.        . cetirizine (ZYRTEC) 10 MG tablet Take 10 mg by mouth daily.        . Coenzyme Q10 (COQ10 PO) Take 1 tablet by mouth daily.        Marland Kitchen. diltiazem (CARDIZEM CD) 240 MG 24 hr capsule TAKE 1 CAPSULE BY MOUTH DAILY  90 capsule  3  . ergocalciferol (VITAMIN D2) 50000 UNITS capsule Take 50,000 Units by mouth. Every 2 weeks       . Magnesium 500 MG CAPS Take 1 capsule by mouth daily.        . Mometasone Furo-Formoterol Fum 200-5 MCG/ACT AERO  Take 2 puffs first thing in the am, as needed      . pravastatin (PRAVACHOL) 40 MG tablet TAKE 1 TABLET BY MOUTH DAILY AT BEDTIME  30 tablet  11   No current facility-administered medications for this visit.    Family History  Problem Relation Age of Onset  . Asthma Father   . Asthma Paternal Grandfather   . Lung cancer Mother     smoked  . Allergies Father     ROS:  Pertinent items are noted in HPI.  Otherwise, a comprehensive ROS was negative.  Exam:   BP 118/64  Pulse 60  Resp 20  Ht 5' 6.75" (1.695 m)  Wt 145 lb 3.2 oz (65.862 kg)  BMI 22.92 kg/m2  LMP 09/22/2008  Weight change: +1   Height: 5' 6.75" (169.5 cm)  Ht Readings from Last 3 Encounters:  01/06/14 5' 6.75" (1.695 m)  04/21/13 5\' 7"  (1.702 m)  04/05/12 5\' 7"  (1.702 m)    General appearance: alert, cooperative and appears stated age Head: Normocephalic, without obvious abnormality, atraumatic Neck: no adenopathy, supple, symmetrical, trachea midline and thyroid normal to inspection and palpation  Lungs: clear to auscultation bilaterally Breasts: normal appearance, no masses or tenderness Heart: regular rate and rhythm Abdomen: soft, non-tender; bowel sounds normal; no masses,  no organomegaly Extremities: extremities normal, atraumatic, no cyanosis or edema Skin: Skin color, texture, turgor normal. No rashes or lesions Lymph nodes: Cervical, supraclavicular, and axillary nodes normal. No abnormal inguinal nodes palpated Neurologic: Grossly normal   Pelvic: External genitalia:  no lesions              Urethra:  normal appearing urethra with no masses, tenderness or lesions              Bartholins and Skenes: normal                 Vagina: normal appearing vagina with normal color and discharge, no lesions              Cervix: no lesions              Pap taken: yes Bimanual Exam:  Uterus:  normal size, contour, position, consistency, mobility, non-tender              Adnexa: normal adnexa and no mass,  fullness, tenderness               Rectovaginal: Confirms               Anus:  normal sphincter tone, no lesions  A:  Well Woman with normal exam Hyperlipidemia H/O SVT, h/o attempt at cardiac ablation that was not successful 12/11 Low Vit D  P:   Mammogram yearly.  D/W pt 3D due to dense breasts pap smear only today Labs with Dr. Rosezetta SchlatterBurnette. Reviewed with pt. return annually or prn  An After Visit Summary was printed and given to the patient.

## 2014-01-10 ENCOUNTER — Other Ambulatory Visit: Payer: Self-pay | Admitting: Obstetrics & Gynecology

## 2014-01-10 DIAGNOSIS — R928 Other abnormal and inconclusive findings on diagnostic imaging of breast: Secondary | ICD-10-CM

## 2014-01-10 LAB — IPS PAP SMEAR ONLY

## 2014-01-18 ENCOUNTER — Ambulatory Visit
Admission: RE | Admit: 2014-01-18 | Discharge: 2014-01-18 | Disposition: A | Discharge status: Home / Self Care | Payer: 59 | Source: Ambulatory Visit | Attending: Obstetrics & Gynecology | Admitting: Obstetrics & Gynecology

## 2014-01-18 DIAGNOSIS — R928 Other abnormal and inconclusive findings on diagnostic imaging of breast: Secondary | ICD-10-CM

## 2014-01-24 ENCOUNTER — Other Ambulatory Visit: Payer: Self-pay | Admitting: Obstetrics and Gynecology

## 2014-01-24 NOTE — Telephone Encounter (Signed)
Last AEX 01/06/2014  Will refill Rx per Dr. Hyacinth MeekerMiller 4/17 note of low Vitamin D.

## 2014-03-31 ENCOUNTER — Other Ambulatory Visit: Payer: Self-pay | Admitting: Internal Medicine

## 2014-06-21 ENCOUNTER — Emergency Department (HOSPITAL_COMMUNITY)
Admission: EM | Admit: 2014-06-21 | Discharge: 2014-06-21 | Disposition: A | Discharge status: Home / Self Care | Payer: 59 | Source: Home / Self Care | Attending: Emergency Medicine | Admitting: Emergency Medicine

## 2014-06-21 ENCOUNTER — Encounter (HOSPITAL_COMMUNITY): Payer: Self-pay | Admitting: Emergency Medicine

## 2014-06-21 DIAGNOSIS — S058X9A Other injuries of unspecified eye and orbit, initial encounter: Secondary | ICD-10-CM

## 2014-06-21 DIAGNOSIS — S0502XA Injury of conjunctiva and corneal abrasion without foreign body, left eye, initial encounter: Secondary | ICD-10-CM

## 2014-06-21 DIAGNOSIS — X58XXXA Exposure to other specified factors, initial encounter: Secondary | ICD-10-CM

## 2014-06-21 MED ORDER — MOXIFLOXACIN HCL 0.5 % OP SOLN: 1.0000 [drp] | Freq: Three times a day (TID) | OPHTHALMIC | Status: DC

## 2014-06-21 MED ORDER — TETRACAINE HCL 0.5 % OP SOLN
2.0000 [drp] | Freq: Once | OPHTHALMIC | Status: AC
  Administered 2014-06-21: 2 [drp] via OPHTHALMIC

## 2014-06-21 MED ORDER — KETOROLAC TROMETHAMINE 0.5 % OP SOLN: 1.0000 [drp] | Freq: Four times a day (QID) | OPHTHALMIC | Status: DC

## 2014-06-21 MED ORDER — TETRACAINE HCL 0.5 % OP SOLN
OPHTHALMIC | Status: AC
  Filled 2014-06-21: qty 2

## 2014-06-21 NOTE — ED Provider Notes (Signed)
  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 She notes excessive tearing, slight redness, and slight blurry vision. She denies any eye trauma or foreign body. 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 There's been no purulent drainage or photophobia. She denies any fever, headache, nasal congestion, sore throat, or adenopathy.  Review of Systems   Other than as noted above, the patient denies any of the following symptoms: Systemic:  No fever, chills, or headache. Eye:  No blurred vision, or diplopia. ENT:  No nasal congestion, rhinorrhea, or sore throat. Lymphatic:  No adenopathy. Skin:  No rash or pruritis.  PMFSH   Past medical history, family history, social history, meds, and allergies were reviewed.    Physical Examination    Vital signs:  BP 128/86  Pulse 75  Temp(Src) 97.3 F (36.3 C) (Oral)  SpO2 96%  LMP 09/22/2008 General:  Alert and in no distress. Eye:  Lids were normal. Conjunctiva was not injected and, there was no purulent drainage. Cornea was visibly intact, anterior chambers normal, PERRLA, full EOMs, fundi were benign. ENT:  TMs and canals clear.  Nasal mucosa normal.  No intra-oral lesions, mucous membranes moist, pharynx clear. Neck:  No adenopathy tenderness or mass. Skin:  Clear, warm and dry.  Course in [facility]   1 drop of tetracaine was instilled into the eye and thereafter the patient experienced complete relief of symptoms. The cornea was stained showing a one to 2 mm corneal abrasion at 11:00 position on the cornea near the periphery.  Assessment   The encounter diagnosis was Corneal abrasion, left, initial encounter.  Corneal abrasion versus corneal ulcer.  Plan     1.  Meds:  The following meds were prescribed:   Discharge Medication List as of 06/21/2014  4:03 PM    START taking these medications   Details  ketorolac  (ACULAR) 0.5 % ophthalmic solution Place 1 drop into the left eye every 6 (six) hours., Starting 06/21/2014, Until Discontinued, Normal    moxifloxacin (VIGAMOX) 0.5 % ophthalmic solution Place 1 drop into the left eye 3 (three) times daily., Starting 06/21/2014, Until Discontinued, Normal        2.  Patient Education/Counseling:  The patient was given appropriate handouts, self care instructions, and instructed in symptomatic relief.    3.  Follow up:  The patient was told to follow up here if no better in 3 to 4 days, or sooner if becoming worse in any way, and given some red flag symptoms such as increasing pain or changes in vision which would prompt immediate return.  Follow up here as needed.      Reuben Likesavid C Cataleya Cristina, MD 06/21/14 [phone]

## 2014-06-21 NOTE — ED Notes (Signed)
Patient c/o left eye pain, tearing since yesterday.  No known injury.  Does not wear contacts.  Has had Lasick surgery.  Mono vision.   Visual acuity  OU  20/25  OD 20/30 ,  OS 20/70

## 2014-06-21 NOTE — Discharge Instructions (Signed)
Corneal Abrasion °The cornea is the clear covering at the front and center of the eye. When looking at the colored portion of the eye (iris), you are looking through the cornea. This very thin tissue is made up of many layers. The surface layer is a single layer of cells (corneal epithelium) and is one of the most sensitive tissues in the body. If a scratch or injury causes the corneal epithelium to come off, it is called a corneal abrasion. If the injury extends to the tissues below the epithelium, the condition is called a corneal ulcer. °CAUSES  °· Scratches. °· Trauma. °· Foreign body in the eye. °Some people have recurrences of abrasions in the area of the original injury even after it has healed (recurrent erosion syndrome). Recurrent erosion syndrome generally improves and goes away with time. °SYMPTOMS  °· Eye pain. °· Difficulty or inability to keep the injured eye open. °· The eye becomes very sensitive to light. °· Recurrent erosions tend to happen suddenly, first thing in the morning, usually after waking up and opening the eye. °DIAGNOSIS  °Your health care provider can diagnose a corneal abrasion during an eye exam. Dye is usually placed in the eye using a drop or a small paper strip moistened by your tears. When the eye is examined with a special light, the abrasion shows up clearly because of the dye. °TREATMENT  °· Small abrasions may be treated with antibiotic drops or ointment alone. °· A pressure patch may be put over the eye. If this is done, follow your doctor's instructions for when to remove the patch. Do not drive or use machines while the eye patch is on. Judging distances is hard to do with a patch on. °If the abrasion becomes infected and spreads to the deeper tissues of the cornea, a corneal ulcer can result. This is serious because it can cause corneal scarring. Corneal scars interfere with light passing through the cornea and cause a loss of vision in the involved eye. °HOME CARE  INSTRUCTIONS °· Use medicine or ointment as directed. Only take over-the-counter or prescription medicines for pain, discomfort, or fever as directed by your health care provider. °· Do not drive or operate machinery if your eye is patched. Your ability to judge distances is impaired. °· If your health care provider has given you a follow-up appointment, it is very important to keep that appointment. Not keeping the appointment could result in a severe eye infection or permanent loss of vision. If there is any problem keeping the appointment, let your health care provider know. °SEEK MEDICAL CARE IF:  °· You have pain, light sensitivity, and a scratchy feeling in one eye or both eyes. °· Your pressure patch keeps loosening up, and you can blink your eye under the patch after treatment. °· Any kind of discharge develops from the eye after treatment or if the lids stick together in the morning. °· You have the same symptoms in the morning as you did with the original abrasion days, weeks, or months after the abrasion healed. °MAKE SURE YOU:  °· Understand these instructions. °· Will watch your condition. °· Will get help right away if you are not doing well or get worse. °Document Released: 09/05/2000 Document Revised: 09/13/2013 Document Reviewed: 05/16/2013 °[employer]® Patient Information ©2015 [employer]. This information is not intended to replace advice given to you by your health care provider. Make sure you discuss any questions you have with your health care provider. ° °

## 2014-07-03 ENCOUNTER — Encounter: Payer: Self-pay | Admitting: Internal Medicine

## 2014-07-03 ENCOUNTER — Ambulatory Visit (INDEPENDENT_AMBULATORY_CARE_PROVIDER_SITE_OTHER): Payer: 59 | Admitting: Internal Medicine

## 2014-07-03 VITALS — BP 126/80 | HR 63 | Ht 67.0 in | Wt 146.0 lb

## 2014-07-03 DIAGNOSIS — E7800 Pure hypercholesterolemia, unspecified: Secondary | ICD-10-CM

## 2014-07-03 DIAGNOSIS — R002 Palpitations: Secondary | ICD-10-CM

## 2014-07-03 DIAGNOSIS — R0602 Shortness of breath: Secondary | ICD-10-CM

## 2014-07-03 DIAGNOSIS — I471 Supraventricular tachycardia: Secondary | ICD-10-CM

## 2014-07-03 MED ORDER — DILTIAZEM HCL ER COATED BEADS 120 MG PO CP24: 120.0000 mg | ORAL_CAPSULE | Freq: Every day | ORAL | Status: DC

## 2014-07-03 NOTE — Patient Instructions (Signed)
Your physician has recommended you make the following change in your medication:  DECREASE CARTIA TO 120 MG DAILY  YOU WILL NEED FASTING LAB WORK TO BE DONE; LIPID AND LIVER PANEL; BMET, MAG LEVEL  Your physician has requested that you have an echocardiogram. Echocardiography is a painless test that uses sound waves to create images of your heart. It provides your doctor with information about the size and shape of your heart and how well your heart's chambers and valves are working. This procedure takes approximately one hour. There are no restrictions for this procedure.  Your physician recommends that you schedule a follow-up appointment in: 1 YEAR WITH DR. ALLRED

## 2014-07-03 NOTE — Addendum Note (Signed)
Addended by: Tarri FullerFIATO, CAROL M on: 07/03/2014 10:21 AM   Modules accepted: Orders

## 2014-07-03 NOTE — Progress Notes (Signed)
PCP: Delorse LekBURNETT,BRENT A, MD  The patient presents today for routine electrophysiology followup.  Since last being seen in our clinic, the patient reports doing very well.  She has occasional SOB and swelling.  These are chronic.  Her SVT is well controlled with cardizem.  Today, she denies symptoms of palpitations, chest pain,orthopnea, PND, dizziness, presyncope, syncope, or neurologic sequela.  The patient feels that she is tolerating medications without difficulties and is otherwise without complaint today.   Past Medical History  Diagnosis Date  . SVT (supraventricular tachycardia)     presumed atrial tachycardia not inducible on EPS 12/11  . Hypercholesteremia   . Mitral valve prolapse   . Asthma   . Allergic rhinitis   . Osteopenia    Past Surgical History  Procedure Laterality Date  . Lumbar fusion  2000  . Ep study  12/11    by JA, no inducible arrhythmia  . Tubal ligation  1988    Current Outpatient Prescriptions  Medication Sig Dispense Refill  . aspirin 81 MG tablet Take 81 mg by mouth daily.        . Calcium Carbonate (CALCARB 600) 1500 MG TABS Take 1 tablet by mouth 2 (two) times daily.        Marland Kitchen. CARTIA XT 240 MG 24 hr capsule TAKE 1 CAPSULE BY MOUTH DAILY  90 capsule  0  . cetirizine (ZYRTEC) 10 MG tablet Take 10 mg by mouth daily.        . Coenzyme Q10 (COQ10 PO) Take 1 tablet by mouth daily.        . Magnesium 500 MG CAPS Take 1 capsule by mouth daily.        . Mometasone Furo-Formoterol Fum 200-5 MCG/ACT AERO Take 2 puffs first thing in the am, as needed      . moxifloxacin (VIGAMOX) 0.5 % ophthalmic solution Place 1 drop into the left eye 3 (three) times daily.  3 mL  0  . pravastatin (PRAVACHOL) 40 MG tablet TAKE 1 TABLET BY MOUTH DAILY AT BEDTIME  30 tablet  11  . Vitamin D, Ergocalciferol, (DRISDOL) 50000 UNITS CAPS capsule TAKE 1 CAPSULE BY MOUTH EVERY OTHER WEEK  24 capsule  2   No current facility-administered medications for this visit.    Allergies   Allergen Reactions  . Oatmeal   . Other     ALL Tree nuts.    History   Social History  . Marital Status: Married    Spouse Name: N/A    Number of Children: 3  . Years of Education: N/A   Occupational History  . 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 Smoking status: Never Smoker   . Smokeless tobacco: Never Used  . Alcohol Use: 7.0 oz/week    14 drink(s) per week  . Drug Use: No  . Sexual Activity: Yes    Partners: Male    Birth Control/ Protection: Surgical     Comment: BTL   Other Topics Concern  . Not on file   Social History Narrative  . No narrative on file   ROS0- all systems are reviewed and negative except as per HPI above  Family History  Problem Relation Age of Onset  . Asthma Father   . Asthma Paternal Grandfather   . Lung cancer Mother     smoked  . Allergies Father    Physical Exam: Filed Vitals:   07/03/14 0946  BP:  126/80  Pulse: 63  Height: 5\' 7"  (1.702 m)  Weight: 146 lb (66.225 kg)    GEN- The patient is well appearing, alert and oriented x 3 today.   Head- normocephalic, atraumatic Eyes-  Sclera clear, conjunctiva pink Ears- hearing intact Oropharynx- clear Neck- supple, no JVP Lymph- no cervical lymphadenopathy Lungs- Clear to ausculation bilaterally, normal work of breathing Heart- Regular rate and rhythm, no murmurs, rubs or gallops, PMI not laterally displaced GI- soft, NT, ND, + BS Extremities- no clubbing, cyanosis, trivial edema  ekg- sinus rhythm, otherwise normal ekg  Assessment and Plan:  1. SVT Well controlled Decrease diltiazem to 120mg  daily to see if swelling improves  2. Mild edema previously BMET and magnesium today Echo to evaluate SOB and edema, follow-up on Mitral valve disease  3. HL Fasting lipids and LFTs  Return in 1 year

## 2014-07-04 ENCOUNTER — Ambulatory Visit (HOSPITAL_COMMUNITY): Payer: 59 | Attending: Internal Medicine | Admitting: Cardiology

## 2014-07-04 ENCOUNTER — Other Ambulatory Visit (INDEPENDENT_AMBULATORY_CARE_PROVIDER_SITE_OTHER): Payer: 59 | Admitting: *Deleted

## 2014-07-04 DIAGNOSIS — E7800 Pure hypercholesterolemia, unspecified: Secondary | ICD-10-CM

## 2014-07-04 DIAGNOSIS — R0602 Shortness of breath: Secondary | ICD-10-CM | POA: Diagnosis not present

## 2014-07-04 DIAGNOSIS — E785 Hyperlipidemia, unspecified: Secondary | ICD-10-CM | POA: Diagnosis not present

## 2014-07-04 DIAGNOSIS — R002 Palpitations: Secondary | ICD-10-CM

## 2014-07-04 DIAGNOSIS — I471 Supraventricular tachycardia: Secondary | ICD-10-CM

## 2014-07-04 LAB — LIPID PANEL
Cholesterol: 208 mg/dL — ABNORMAL HIGH (ref 0–200)
HDL: 34.1 mg/dL — ABNORMAL LOW (ref >39.00)
LDL Cholesterol: 155 mg/dL — ABNORMAL HIGH (ref 0–99)
NONHDL: 173.9
Total CHOL/HDL Ratio: 6
Triglycerides: 96 mg/dL (ref 0.0–149.0)
VLDL: 19.2 mg/dL (ref 0.0–40.0)

## 2014-07-04 LAB — HEPATIC FUNCTION PANEL
ALT: 17 U/L (ref 0–35)
AST: 15 U/L (ref 0–37)
Albumin: 3.8 g/dL (ref 3.5–5.2)
Alkaline Phosphatase: 80 U/L (ref 39–117)
BILIRUBIN DIRECT: 0 mg/dL (ref 0.0–0.3)
BILIRUBIN TOTAL: 0.9 mg/dL (ref 0.2–1.2)
Total Protein: 7.2 g/dL (ref 6.0–8.3)

## 2014-07-04 LAB — BASIC METABOLIC PANEL
BUN: 10 mg/dL (ref 6–23)
CO2: 24 meq/L (ref 19–32)
Calcium: 9.4 mg/dL (ref 8.4–10.5)
Chloride: 104 mEq/L (ref 96–112)
Creatinine, Ser: 0.6 mg/dL (ref 0.4–1.2)
GFR: 109.03 mL/min (ref >60.00)
GLUCOSE: 97 mg/dL (ref 70–99)
POTASSIUM: 4.5 meq/L (ref 3.5–5.1)
Sodium: 136 mEq/L (ref 135–145)

## 2014-07-04 LAB — MAGNESIUM: Magnesium: 2.2 mg/dL (ref 1.5–2.5)

## 2014-07-04 NOTE — Progress Notes (Signed)
Echo performed. 

## 2014-07-11 ENCOUNTER — Other Ambulatory Visit: Payer: Self-pay | Admitting: Obstetrics & Gynecology

## 2014-07-11 DIAGNOSIS — N6489 Other specified disorders of breast: Secondary | ICD-10-CM

## 2014-07-17 ENCOUNTER — Other Ambulatory Visit: Payer: Self-pay

## 2014-07-17 ENCOUNTER — Other Ambulatory Visit: Payer: Self-pay | Admitting: Obstetrics & Gynecology

## 2014-07-17 DIAGNOSIS — N6489 Other specified disorders of breast: Secondary | ICD-10-CM

## 2014-07-21 ENCOUNTER — Ambulatory Visit
Admission: RE | Admit: 2014-07-21 | Discharge: 2014-07-21 | Disposition: A | Discharge status: Home / Self Care | Payer: 59 | Source: Ambulatory Visit | Attending: Obstetrics & Gynecology | Admitting: Obstetrics & Gynecology

## 2014-07-21 DIAGNOSIS — N6489 Other specified disorders of breast: Secondary | ICD-10-CM

## 2014-07-24 ENCOUNTER — Encounter: Payer: Self-pay | Admitting: Internal Medicine

## 2014-08-14 ENCOUNTER — Other Ambulatory Visit: Payer: Self-pay | Admitting: Internal Medicine

## 2014-11-10 ENCOUNTER — Encounter: Payer: Self-pay | Admitting: Gastroenterology

## 2015-02-01 ENCOUNTER — Ambulatory Visit (INDEPENDENT_AMBULATORY_CARE_PROVIDER_SITE_OTHER): Payer: 59 | Admitting: Obstetrics & Gynecology

## 2015-02-01 ENCOUNTER — Other Ambulatory Visit: Payer: Self-pay | Admitting: Obstetrics & Gynecology

## 2015-02-01 ENCOUNTER — Encounter: Payer: Self-pay | Admitting: Obstetrics & Gynecology

## 2015-02-01 VITALS — BP 104/62 | HR 68 | Resp 16 | Ht 66.5 in | Wt 145.4 lb

## 2015-02-01 DIAGNOSIS — Z0000 Encounter for general adult medical examination without abnormal findings: Secondary | ICD-10-CM | POA: Diagnosis not present

## 2015-02-01 DIAGNOSIS — M8580 Other specified disorders of bone density and structure, unspecified site: Secondary | ICD-10-CM

## 2015-02-01 DIAGNOSIS — E559 Vitamin D deficiency, unspecified: Secondary | ICD-10-CM | POA: Diagnosis not present

## 2015-02-01 DIAGNOSIS — Z01419 Encounter for gynecological examination (general) (routine) without abnormal findings: Secondary | ICD-10-CM | POA: Diagnosis not present

## 2015-02-01 DIAGNOSIS — Z1231 Encounter for screening mammogram for malignant neoplasm of breast: Secondary | ICD-10-CM

## 2015-02-01 LAB — HEMOGLOBIN, FINGERSTICK: HEMOGLOBIN, FINGERSTICK: 13.1 g/dL (ref 12.0–16.0)

## 2015-02-01 LAB — POCT URINALYSIS DIPSTICK
BILIRUBIN UA: NEGATIVE
Glucose, UA: NEGATIVE
Ketones, UA: NEGATIVE
Nitrite, UA: NEGATIVE
PH UA: 8
PROTEIN UA: NEGATIVE
RBC UA: NEGATIVE
Urobilinogen, UA: NEGATIVE

## 2015-02-01 MED ORDER — VITAMIN D (ERGOCALCIFEROL) 1.25 MG (50000 UNIT) PO CAPS: ORAL_CAPSULE | ORAL | Status: DC

## 2015-02-01 NOTE — Progress Notes (Signed)
59 y.o. G3P3 MarriedCaucasianF here for annual exam.  Doing well.  Pt still seeing cardiologist yearly.  Diltiazem dosage was decreased.  Has been told "officially" she doesn't have afib.  Now, diagnosis is just SVT.    No vaginal bleeding.    PCP:  Dr. Doristine CounterBurnett.  Labs done in 2/16.    Patient's last menstrual period was 09/22/2008.          Sexually active: Yes.    The current method of family planning is tubal ligation.    Exercising: Yes.    walking and yoga Smoker:  no  Health Maintenance: Pap:  01/06/14 WNL, neg HR HPV 2013 History of abnormal Pap:  yes MMG:  01/05/14-MMG, 01/18/14-left diag-6 month f/u, 07/21/14-6 month 3D f/u-normal-screening in 6 months--pt aware this is due Colonoscopy:  12/10-repeat in 5 years.  Pt will do it this year. BMD:   12/11-osteopenia TDaP:  2009 Screening Labs: 2/16 with Dr. Rosezetta SchlatterBurnette, Hb today: 13.1, Urine today: WBC-trace, PH-8   reports that she has never smoked. She has never used smokeless tobacco. She reports that she drinks about 7.0 oz of alcohol per week. She reports that she does not use illicit drugs.  Past Medical History  Diagnosis Date  . SVT (supraventricular tachycardia)     presumed atrial tachycardia not inducible on EPS 12/11  . Hypercholesteremia   . Mitral valve prolapse   . Asthma   . Allergic rhinitis   . Osteopenia     Past Surgical History  Procedure Laterality Date  . Lumbar fusion  2000  . Ep study  12/11    by JA, no inducible arrhythmia  . Tubal ligation  1988    Current Outpatient Prescriptions  Medication Sig Dispense Refill  . aspirin 81 MG tablet Take 81 mg by mouth daily.      . Calcium Carbonate (CALCARB 600) 1500 MG TABS Take 1 tablet by mouth 2 (two) times daily.      . Coenzyme Q10 (COQ10 PO) Take 1 tablet by mouth daily.      Marland Kitchen. diltiazem (CARTIA XT) 120 MG 24 hr capsule Take 1 capsule (120 mg total) by mouth daily. 90 capsule 3  . Magnesium 500 MG CAPS Take 1 capsule by mouth daily.      .  Mometasone Furo-Formoterol Fum 200-5 MCG/ACT AERO Take 2 puffs first thing in the am, as needed    . pravastatin (PRAVACHOL) 40 MG tablet TAKE 1 TABLET BY MOUTH DAILY AT BEDTIME 30 tablet 11  . Vitamin D, Ergocalciferol, (DRISDOL) 50000 UNITS CAPS capsule TAKE 1 CAPSULE BY MOUTH EVERY OTHER WEEK 24 capsule 2   No current facility-administered medications for this visit.    Family History  Problem Relation Age of Onset  . Asthma Father   . Asthma Paternal Grandfather   . Lung cancer Mother     smoked  . Allergies Father     ROS:  Pertinent items are noted in HPI.  Otherwise, a comprehensive ROS was negative.  Exam:   BP 104/62 mmHg  Pulse 68  Resp 16  Ht 5' 6.5" (1.689 m)  Wt 145 lb 6.4 oz (65.953 kg)  BMI 23.12 kg/m2  LMP 09/22/2008  Weight change: stable    Height: 5' 6.5" (168.9 cm)  Ht Readings from Last 3 Encounters:  02/01/15 5' 6.5" (1.689 m)  07/03/14 5\' 7"  (1.702 m)  01/06/14 5' 6.75" (1.695 m)    General appearance: alert, cooperative and appears stated age Head: Normocephalic, without  obvious abnormality, atraumatic Neck: no adenopathy, supple, symmetrical, trachea midline and thyroid normal to inspection and palpation Lungs: clear to auscultation bilaterally Breasts: normal appearance, no masses or tenderness Heart: regular rate and rhythm Abdomen: soft, non-tender; bowel sounds normal; no masses,  no organomegaly Extremities: extremities normal, atraumatic, no cyanosis or edema Skin: Skin color, texture, turgor normal. No rashes or lesions Lymph nodes: Cervical, supraclavicular, and axillary nodes normal. No abnormal inguinal nodes palpated Neurologic: Grossly normal   Pelvic: External genitalia:  no lesions              Urethra:  normal appearing urethra with no masses, tenderness or lesions              Bartholins and Skenes: normal                 Vagina: normal appearing vagina with normal color and discharge, no lesions              Cervix: no  lesions              Pap taken: Yes.   Bimanual Exam:  Uterus:  normal size, contour, position, consistency, mobility, non-tender              Adnexa: normal adnexa and no mass, fullness, tenderness               Rectovaginal: Confirms               Anus:  normal sphincter tone, no lesions  Chaperone was present for exam.  A:  Well Woman with normal exam Hyperlipidemia H/O SVT, h/o attempt at cardiac ablation that was not successful 12/11 Vit D deficiency  P: Mammogram follow up is due.  Will scheduled for pt today when in office BMD scheduled as well Pt will schedule colonoscopy Labs with Dr. Doristine CounterBurnett yearly. Vit D today.  Vit D 50K every 14 days.  #6/4RF return annually or prn

## 2015-02-02 LAB — VITAMIN D 25 HYDROXY (VIT D DEFICIENCY, FRACTURES): Vit D, 25-Hydroxy: 28 ng/mL — ABNORMAL LOW (ref 30–100)

## 2015-02-12 ENCOUNTER — Other Ambulatory Visit: Payer: Self-pay | Admitting: Obstetrics & Gynecology

## 2015-02-12 MED ORDER — VITAMIN D (ERGOCALCIFEROL) 1.25 MG (50000 UNIT) PO CAPS: ORAL_CAPSULE | ORAL | Status: DC

## 2015-02-12 NOTE — Telephone Encounter (Signed)
LM on patient's vm that rx has been sent to pharmacy. 

## 2015-02-12 NOTE — Telephone Encounter (Signed)
Patient is requesting a refill of Vitamin D to take every other week. Confirmed pharmacy with patient.

## 2015-02-12 NOTE — Telephone Encounter (Signed)
Medication refill request: Vitamin D 50,000 iu's Last AEX:  02/01/15 with SM Next AEX: 04/17/16 with SM Last vitamin d checked 02/01/15 at 28 Refill authorized: Please advise.

## 2015-02-15 ENCOUNTER — Other Ambulatory Visit: Payer: Self-pay | Admitting: Obstetrics & Gynecology

## 2015-02-15 ENCOUNTER — Other Ambulatory Visit: Payer: Self-pay

## 2015-02-15 DIAGNOSIS — M8580 Other specified disorders of bone density and structure, unspecified site: Secondary | ICD-10-CM

## 2015-02-16 ENCOUNTER — Ambulatory Visit: Payer: 59

## 2015-02-16 ENCOUNTER — Other Ambulatory Visit: Payer: 59

## 2015-02-23 ENCOUNTER — Ambulatory Visit
Admission: RE | Admit: 2015-02-23 | Discharge: 2015-02-23 | Disposition: A | Discharge status: Home / Self Care | Payer: 59 | Source: Ambulatory Visit | Attending: Obstetrics & Gynecology | Admitting: Obstetrics & Gynecology

## 2015-02-23 DIAGNOSIS — M8580 Other specified disorders of bone density and structure, unspecified site: Secondary | ICD-10-CM

## 2015-02-23 DIAGNOSIS — Z1231 Encounter for screening mammogram for malignant neoplasm of breast: Secondary | ICD-10-CM

## 2015-04-23 ENCOUNTER — Other Ambulatory Visit: Payer: Self-pay | Admitting: Obstetrics & Gynecology

## 2015-04-23 MED ORDER — VITAMIN D (ERGOCALCIFEROL) 1.25 MG (50000 UNIT) PO CAPS: 50000.0000 [IU] | ORAL_CAPSULE | ORAL | Status: DC

## 2015-04-23 NOTE — Telephone Encounter (Signed)
Patient calling to check on refill for vitamin D that should have been sent to [facility].

## 2015-04-23 NOTE — Telephone Encounter (Signed)
Medication refill request: Vit D Last AEX:  02-01-15 Next AEX: 04-17-16 Last MMG (if hormonal medication request):  Refill authorized: patient is taking every week instead of every other. She is needing the RX corrected.

## 2015-07-02 ENCOUNTER — Encounter: Payer: Self-pay | Admitting: Internal Medicine

## 2015-07-02 ENCOUNTER — Ambulatory Visit (INDEPENDENT_AMBULATORY_CARE_PROVIDER_SITE_OTHER): Payer: 59 | Admitting: Internal Medicine

## 2015-07-02 VITALS — BP 130/78 | HR 61 | Ht 67.0 in | Wt 140.0 lb

## 2015-07-02 DIAGNOSIS — I471 Supraventricular tachycardia: Secondary | ICD-10-CM | POA: Diagnosis not present

## 2015-07-02 DIAGNOSIS — Z79899 Other long term (current) drug therapy: Secondary | ICD-10-CM

## 2015-07-02 LAB — HEPATIC FUNCTION PANEL
ALBUMIN: 4.5 g/dL (ref 3.6–5.1)
ALK PHOS: 80 U/L (ref 33–130)
ALT: 19 U/L (ref 6–29)
AST: 19 U/L (ref 10–35)
BILIRUBIN DIRECT: 0.2 mg/dL (ref <=0.2)
BILIRUBIN INDIRECT: 0.7 mg/dL (ref 0.2–1.2)
BILIRUBIN TOTAL: 0.9 mg/dL (ref 0.2–1.2)
Total Protein: 6.7 g/dL (ref 6.1–8.1)

## 2015-07-02 LAB — LIPID PANEL
CHOL/HDL RATIO: 4.2 ratio (ref <=5.0)
CHOLESTEROL: 169 mg/dL (ref 125–200)
HDL: 40 mg/dL — ABNORMAL LOW (ref >=46)
LDL Cholesterol: 108 mg/dL (ref <130)
Triglycerides: 104 mg/dL (ref <150)
VLDL: 21 mg/dL (ref <30)

## 2015-07-02 NOTE — Patient Instructions (Addendum)
Medication Instructions:  Your physician recommends that you continue on your current medications as directed. Please refer to the Current Medication list given to you today.  Labwork: Today: Fasting lipid panel, LFTs  Testing/Procedures: None ordered  Follow-Up: Your physician wants you to follow-up in: 1 year with Dr. Johney Frame.  You will receive a reminder letter in the mail two months in advance. If you don't receive a letter, please call our office to schedule the follow-up appointment.   Any Other Special Instructions Will Be Listed Below (If Applicable). Thank you for choosing  [facility]!!

## 2015-07-02 NOTE — Progress Notes (Signed)
PCP: Delorse Lek, MD  The patient presents today for routine electrophysiology followup.  Since last being seen in our clinic, the patient reports doing very well.   Her SVT is well controlled with cardizem.  Her swelling has resolved with reduced dosing. Today, she denies symptoms of palpitations, chest pain,orthopnea, PND, dizziness, presyncope, syncope, or neurologic sequela.  The patient feels that she is tolerating medications without difficulties and is otherwise without complaint today.   Past Medical History  Diagnosis Date  . SVT (supraventricular tachycardia) (HCC)     presumed atrial tachycardia not inducible on EPS 12/11  . Hypercholesteremia   . Mitral valve prolapse   . Asthma   . Allergic rhinitis   . Osteopenia    Past Surgical History  Procedure Laterality Date  . Lumbar fusion  2000  . Ep study  12/11    by JA, no inducible arrhythmia  . Tubal ligation  1988    Current Outpatient Prescriptions  Medication Sig Dispense Refill  . aspirin 81 MG tablet Take 81 mg by mouth daily.      . Calcium Carbonate (CALCARB 600) 1500 MG TABS Take 1 tablet by mouth 2 (two) times daily.      . Coenzyme Q10 (COQ10 PO) Take 1 tablet by mouth daily.      Marland Kitchen diltiazem (CARTIA XT) 120 MG 24 hr capsule Take 1 capsule (120 mg total) by mouth daily. 90 capsule 3  . Magnesium 500 MG CAPS Take 1 capsule by mouth daily.      . Mometasone Furo-Formoterol Fum 200-5 MCG/ACT AERO Take 2 puffs first thing in the am, as needed    . pravastatin (PRAVACHOL) 40 MG tablet TAKE 1 TABLET BY MOUTH DAILY AT BEDTIME 30 tablet 11  . Vitamin D, Ergocalciferol, (DRISDOL) 50000 UNITS CAPS capsule Take 1 capsule (50,000 Units total) by mouth every 7 (seven) days. 12 capsule 4   No current facility-administered medications for this visit.    Allergies  Allergen Reactions  . Oatmeal   . Other     ALL Tree nuts.    Social History   Social History  . Marital Status: Married    Spouse Name: N/A  .  Number of Children: 3  . Years of Education: N/A   Occupational History  . RN     case manager for [employer]   Social History Main Topics  . Smoking status: Never Smoker   . Smokeless tobacco: Never Used  . Alcohol Use: 7.0 oz/week    14 drink(s) per week  . Drug Use: No  . Sexual Activity:    Partners: Male    Birth Control/ Protection: Surgical     Comment: BTL   Other Topics Concern  . Not on file   Social History Narrative   ROS0- all systems are reviewed and negative except as per HPI above  Family History  Problem Relation Age of Onset  . Asthma Father   . Asthma Paternal Grandfather   . Lung cancer Mother     smoked  . Allergies Father    Physical Exam: Filed Vitals:   07/02/15 0931  BP: 130/78  Pulse: 61  Height:  (1.702 m)  Weight: 140 lb (63.504 kg)    GEN- The patient is well appearing, alert and oriented x 3 today.   Head- normocephalic, atraumatic Eyes-  Sclera clear, conjunctiva pink Ears- hearing intact Oropharynx- clear Neck- supple, no JVP Lymph- no cervical lymphadenopathy Lungs- Clear to ausculation bilaterally,  normal work of breathing Heart- Regular rate and rhythm, no murmurs, rubs or gallops, PMI not laterally displaced GI- soft, NT, ND, + BS Extremities- no clubbing, cyanosis, trivial edema  ekg- sinus rhythm, otherwise normal ekg Echo 2015 is reviewed with the patient today  Assessment and Plan:  1. SVT Well controlled No changes today  2. Mild edema previously resolved with reduce cardizem dosing  3. HL Fasting lipids and LFTs today  Return in 1 year

## 2015-07-04 ENCOUNTER — Other Ambulatory Visit: Payer: Self-pay | Admitting: Internal Medicine

## 2015-07-23 ENCOUNTER — Encounter: Payer: Self-pay | Admitting: Gastroenterology

## 2015-10-04 MED FILL — DILTIAZEM 24HR ER 120 MG CA: 120 | 90 days supply | Qty: 90 | Fill #1

## 2015-10-16 MED FILL — VIT D2 1.25 MG (50,000 UNIT: 1.25 MG | 84 days supply | Qty: 12 | Fill #2

## 2015-10-23 ENCOUNTER — Encounter (HOSPITAL_COMMUNITY): Payer: Self-pay | Admitting: *Deleted

## 2015-10-23 ENCOUNTER — Emergency Department (HOSPITAL_COMMUNITY)
Admission: EM | Admit: 2015-10-23 | Discharge: 2015-10-23 | Disposition: A | Discharge status: Home / Self Care | Payer: 59 | Source: Home / Self Care | Attending: Family Medicine | Admitting: Family Medicine

## 2015-10-23 DIAGNOSIS — N390 Urinary tract infection, site not specified: Secondary | ICD-10-CM | POA: Diagnosis not present

## 2015-10-23 LAB — POCT URINALYSIS DIP (DEVICE)
Bilirubin Urine: NEGATIVE
Glucose, UA: NEGATIVE mg/dL
KETONES UR: NEGATIVE mg/dL
Nitrite: POSITIVE — AB
PH: 7.5 (ref 5.0–8.0)
PROTEIN: NEGATIVE mg/dL
Specific Gravity, Urine: 1.015 (ref 1.005–1.030)
Urobilinogen, UA: 0.2 mg/dL (ref 0.0–1.0)

## 2015-10-23 MED ORDER — CEPHALEXIN 500 MG PO CAPS: 500.0000 mg | ORAL_CAPSULE | Freq: Four times a day (QID) | ORAL | Status: DC

## 2015-10-23 NOTE — Discharge Instructions (Signed)
Take all of medicine as directed, drink lots of fluids, see your doctor if further problems. °

## 2015-10-23 NOTE — ED Notes (Signed)
Pt  Reports    Symptoms     Of     Pain  And  Frequency  Of  Urination        With  Foul  Odor    Pt   Reports    Symptoms    Of      Started  Yesterday

## 2015-10-23 NOTE — ED Provider Notes (Addendum)
CSN: [id number]     Arrival date & time 10/23/15  1620 History   First MD Initiated Contact with Patient 10/23/15 1805     Chief Complaint  Patient presents with  . Urinary Tract Infection   (Consider location/radiation/quality/duration/timing/severity/associated sxs/prior Treatment) Patient is a 60 y.o. female presenting with urinary tract infection. The history is provided by the patient.  Urinary Tract Infection Pain quality:  Burning Pain severity:  Moderate Onset quality:  Sudden Duration:  1 day Progression:  Worsening Chronicity:  Chronic Recent urinary tract infections: no (none in 9yrs)   Relieved by:  None tried Worsened by:  Nothing tried Ineffective treatments:  None tried Urinary symptoms: foul-smelling urine, frequent urination and hematuria   Associated symptoms: no abdominal pain, no fever, no flank pain, no nausea and no vomiting   Risk factors: recurrent urinary tract infections     Past Medical History  Diagnosis Date  . SVT (supraventricular tachycardia) (HCC)     presumed atrial tachycardia not inducible on EPS 12/11  . Hypercholesteremia   . Mitral valve prolapse   . Asthma   . Allergic rhinitis   . Osteopenia    Past Surgical History  Procedure Laterality Date  . Lumbar fusion  2000  . Ep study  12/11    by JA, no inducible arrhythmia  . Tubal ligation  1988   Family History  Problem Relation Age of Onset  . Asthma Father   . Asthma Paternal Grandfather   . Lung cancer Mother     smoked  . Allergies Father    Social History  Substance Use Topics  . Smoking status: Never Smoker   . Smokeless tobacco: Never Used  . Alcohol Use: 7.0 oz/week    14 drink(s) per week   OB History    Gravida Para Term Preterm AB TAB SAB Ectopic Multiple Living   Review of Systems  Constitutional: Negative.  Negative for fever.  Cardiovascular: Negative.   Gastrointestinal: Negative.  Negative for nausea, vomiting and abdominal pain.   Genitourinary: Positive for dysuria, urgency and frequency. Negative for flank pain, menstrual problem and pelvic pain.  All other systems reviewed and are negative.   Allergies  Oatmeal and Other  Home Medications   Prior to Admission medications   Medication Sig Start Date End Date Taking? Authorizing Provider  aspirin 81 MG tablet Take 81 mg by mouth daily.      Historical Provider, MD  Calcium Carbonate (CALCARB 600) 1500 MG TABS Take 1 tablet by mouth 2 (two) times daily.      Historical Provider, MD  cephALEXin (KEFLEX) 500 MG capsule Take 1 capsule (500 mg total) by mouth 4 (four) times daily. Take all of medicine and drink lots of fluids 10/23/15   Linna Hoff, MD  Coenzyme Q10 (COQ10 PO) Take 1 tablet by mouth daily.      Historical Provider, MD  diltiazem (CARDIZEM CD) 120 MG 24 hr capsule TAKE 1 CAPSULE BY MOUTH DAILY. 07/04/15   Hillis Range, MD  Magnesium 500 MG CAPS Take 1 capsule by mouth daily.      Historical Provider, MD  Mometasone Furo-Formoterol Fum 200-5 MCG/ACT AERO Take 2 puffs first thing in the am, as needed 08/01/11   Nyoka Cowden, MD  pravastatin (PRAVACHOL) 40 MG tablet TAKE 1 TABLET BY MOUTH DAILY AT BEDTIME 08/15/14   Hillis Range, MD  Vitamin D, Ergocalciferol, (DRISDOL)  50000 UNITS CAPS capsule Take 1 capsule (50,000 Units total) by mouth every 7 (seven) days. 04/23/15   Jerene Bears, MD   Meds Ordered and Administered this Visit  Medications - No data to display  BP 125/85 mmHg  Pulse 68  Temp(Src) 98.1 F (36.7 C) (Oral)  Resp 20  SpO2 98%  LMP 09/22/2008 No data found.   Physical Exam  Constitutional: She is oriented to person, place, and time. She appears well-developed and well-nourished.  Neck: Normal range of motion. Neck supple.  Abdominal: Soft. Bowel sounds are normal. She exhibits no distension and no mass. There is no tenderness. There is no rebound and no guarding.  Neurological: She is alert and oriented to person, place, and  time.  Skin: Skin is warm and dry.  Nursing note and vitals reviewed.   ED Course  Procedures (including critical care time)  Labs Review Labs Reviewed  POCT URINALYSIS DIP (DEVICE) - Abnormal; Notable for the following:    Hgb urine dipstick MODERATE (*)    Nitrite POSITIVE (*)    Leukocytes, UA SMALL (*)    All other components within normal limits   U/a abnl  Imaging Review No results found.   Visual Acuity Review  Right Eye Distance:   Left Eye Distance:   Bilateral Distance:    Right Eye Near:   Left Eye Near:    Bilateral Near:         MDM   1. UTI (lower urinary tract infection)        Linna Hoff, MD 10/23/15 1847  Linna Hoff, MD 10/23/15 2047

## 2016-01-02 ENCOUNTER — Other Ambulatory Visit: Payer: Self-pay | Admitting: Internal Medicine

## 2016-01-02 MED FILL — CARTIA XT 120 MG CAPSULE: 120 | 90 days supply | Qty: 90 | Fill #2

## 2016-01-02 MED FILL — VIT D2 1.25 MG (50,000 UNIT: 1.25 MG | 84 days supply | Qty: 12 | Fill #3

## 2016-01-02 MED FILL — PRAVASTATIN NA 40 MG TAB: 40 | 90 days supply | Qty: 90 | Fill #0

## 2016-01-08 DIAGNOSIS — R309 Painful micturition, unspecified: Secondary | ICD-10-CM | POA: Diagnosis not present

## 2016-01-08 DIAGNOSIS — I3489 Other nonrheumatic mitral valve disorders: Secondary | ICD-10-CM | POA: Insufficient documentation

## 2016-01-08 DIAGNOSIS — N390 Urinary tract infection, site not specified: Secondary | ICD-10-CM | POA: Diagnosis not present

## 2016-01-08 DIAGNOSIS — I348 Other nonrheumatic mitral valve disorders: Secondary | ICD-10-CM | POA: Insufficient documentation

## 2016-02-27 ENCOUNTER — Encounter: Payer: Self-pay | Admitting: Gastroenterology

## 2016-04-02 MED FILL — CARTIA XT 120 MG CAPSULE SA: 120 | 90 days supply | Qty: 90 | Fill #3

## 2016-04-07 ENCOUNTER — Telehealth: Payer: Self-pay

## 2016-04-07 NOTE — Telephone Encounter (Signed)
Yes, double prep protocol please

## 2016-04-07 NOTE — Telephone Encounter (Signed)
Dr Christella HartiganJacobs,      2010 report indicated "poor prep" per DP.  ?2 day prep OK with you?                                                                                                                       (Suprep and Miralax) Thank you, Angela//PV

## 2016-04-08 MED FILL — VIT D2 1.25 MG (50,000 UNIT: 1.25 MG | 84 days supply | Qty: 12 | Fill #4

## 2016-04-09 ENCOUNTER — Ambulatory Visit (AMBULATORY_SURGERY_CENTER): Payer: Self-pay

## 2016-04-09 VITALS — Ht 66.25 in | Wt 138.6 lb

## 2016-04-09 DIAGNOSIS — Z860100 Personal history of colon polyps, unspecified: Secondary | ICD-10-CM

## 2016-04-09 MED ORDER — SUPREP BOWEL PREP KIT 17.5-3.13-1.6 GM/177ML PO SOLN: 1.0000 | Freq: Once | ORAL | Status: DC

## 2016-04-09 NOTE — Progress Notes (Signed)
No allergies to eggs or soy No past problems with anesthesia No home oxygen No diet meds  Has email and internet; declined emmi 

## 2016-04-16 MED FILL — SUPREP BOWEL PREP KIT: 17.5-3.13-1 | 1 days supply | Qty: 354 | Fill #0

## 2016-04-17 ENCOUNTER — Ambulatory Visit: Payer: 59 | Admitting: Obstetrics & Gynecology

## 2016-04-17 ENCOUNTER — Encounter: Payer: Self-pay | Admitting: Obstetrics & Gynecology

## 2016-04-17 VITALS — BP 120/80 | HR 68 | Ht 66.5 in | Wt 138.0 lb

## 2016-04-17 DIAGNOSIS — Z01419 Encounter for gynecological examination (general) (routine) without abnormal findings: Secondary | ICD-10-CM

## 2016-04-17 DIAGNOSIS — Z0000 Encounter for general adult medical examination without abnormal findings: Secondary | ICD-10-CM

## 2016-04-17 DIAGNOSIS — Z124 Encounter for screening for malignant neoplasm of cervix: Secondary | ICD-10-CM

## 2016-04-17 DIAGNOSIS — Z1151 Encounter for screening for human papillomavirus (HPV): Secondary | ICD-10-CM | POA: Diagnosis not present

## 2016-04-17 LAB — TSH: TSH: 0.82 mIU/L

## 2016-04-17 MED ORDER — VITAMIN D (ERGOCALCIFEROL) 1.25 MG (50000 UNIT) PO CAPS: 50000.0000 [IU] | ORAL_CAPSULE | ORAL | 4 refills | Status: DC

## 2016-04-17 NOTE — Addendum Note (Signed)
Addended by: Loreta Ave on: 04/17/2016 04:58 PM   Modules accepted: Orders

## 2016-04-17 NOTE — Progress Notes (Signed)
60 y.o. G3P3 MarriedCaucasianF here for annual exam.     Patient's last menstrual period was 09/22/2008 (approximate).          Sexually active: Yes.    The current method of family planning is post menopausal status.    Exercising: Yes.    walking, yoga, weights Smoker:  no  Health Maintenance: Pap:  01/06/14 Neg. 2013 HR HPV:neg History of abnormal Pap:  yes MMG:  02/23/15 BIRADS1:neg Colonoscopy:  09/19/09 Sessile Polyp- f/u 5 years. Has appt 8/2 BMD:   02/23/15 Osteopenia   TDaP:  2009  Pneumonia vaccine(s):  No Zostavax:   No Hep C testing: Donates Blood Screening Labs: Vit D, Urine today: PCP   reports that she has never smoked. She has never used smokeless tobacco. She reports that she drinks about 8.4 oz of alcohol per week . She reports that she does not use drugs.  Past Medical History:  Diagnosis Date  . Allergic rhinitis   . Asthma   . Hypercholesteremia   . Mitral valve prolapse   . Osteopenia   . SVT (supraventricular tachycardia) ([facility])    presumed atrial tachycardia not inducible on EPS 12/11    Past Surgical History:  Procedure Laterality Date  . EP study  12/11   by JA, no inducible arrhythmia  . LUMBAR FUSION  2000  . TUBAL LIGATION  1988    Current Outpatient Prescriptions  Medication Sig Dispense Refill  . aspirin 81 MG tablet Take 81 mg by mouth daily.      . Calcium Carbonate (CALCARB 600) 1500 MG TABS Take 1 tablet by mouth 2 (two) times daily.      . Coenzyme Q10 (COQ10 PO) Take 1 tablet by mouth daily.      Marland Kitchen diltiazem (CARDIZEM CD) 120 MG 24 hr capsule TAKE 1 CAPSULE BY MOUTH DAILY. 90 capsule 3  . Magnesium 500 MG CAPS Take 1 capsule by mouth daily.      . Mometasone Furo-Formoterol Fum 200-5 MCG/ACT AERO Take 2 puffs first thing in the am, as needed    . pravastatin (PRAVACHOL) 40 MG tablet TAKE 1 TABLET BY MOUTH DAILY AT BEDTIME 30 tablet 5  . vitamin B-12 (CYANOCOBALAMIN) 1000 MCG tablet Take 1,000 mcg by mouth daily.    . Vitamin D,  Ergocalciferol, (DRISDOL) 50000 UNITS CAPS capsule Take 1 capsule (50,000 Units total) by mouth every 7 (seven) days. 12 capsule 4  . SUPREP BOWEL PREP KIT 17.5-3.13-1.6 GM/180ML SOLN Take 1 kit by mouth once. (Patient not taking: Reported on 04/17/2016) 354 mL 0   No current facility-administered medications for this visit.     Family History  Problem Relation Age of Onset  . Asthma Father   . Allergies Father   . Asthma Paternal Grandfather   . Lung cancer Mother     smoked  . Colon cancer Neg Hx     ROS:  Pertinent items are noted in HPI.  Otherwise, a comprehensive ROS was negative.  Exam:   BP 120/80 (BP Location: Right Arm, Patient Position: Sitting, Cuff Size: Normal)   Pulse 68   Ht 5' 6.5" (1.689 m)   Wt 138 lb (62.6 kg)   LMP 09/22/2008 (Approximate)   BMI 21.94 kg/m    Height: 5' 6.5" (168.9 cm)  Ht Readings from Last 3 Encounters:  04/17/16 5' 6.5" (1.689 m)  04/09/16 5' 6.25" (1.683 m)  07/02/15 _0  (1.702 m)    General appearance: alert, cooperative and appears stated  age Head: Normocephalic, without obvious abnormality, atraumatic Neck: no adenopathy, supple, symmetrical, trachea midline and thyroid normal to inspection and palpation Lungs: clear to auscultation bilaterally Breasts: normal appearance, no masses or tenderness Heart: regular rate and rhythm Abdomen: soft, non-tender; bowel sounds normal; no masses,  no organomegaly Extremities: extremities normal, atraumatic, no cyanosis or edema Skin: Skin color, texture, turgor normal. No rashes or lesions Lymph nodes: Cervical, supraclavicular, and axillary nodes normal. No abnormal inguinal nodes palpated Neurologic: Grossly normal   Pelvic: External genitalia:  no lesions              Urethra:  normal appearing urethra with no masses, tenderness or lesions              Bartholins and Skenes: normal                 Vagina: normal appearing vagina with normal color and discharge, no lesions               Cervix: no lesions              Pap taken: Yes.   Bimanual Exam:  Uterus:  normal size, contour, position, consistency, mobility, non-tender              Adnexa: normal adnexa               Rectovaginal: Confirms               Anus:  normal sphincter tone, no lesions  Chaperone was present for exam.  A:  Well Woman with normal exam Hyperlipidemia H/O SVT, h/o attempt at cardiac ablation that was not successful 12/11.  Sees Dr. Joylene Grapes yearly. Vit D deficiency  P: Mammogram due.  Pt aware should do this yearly. Colonoscopy scheduled for next week CBC, VIt D, and TSH today Vit D 50K every 14 days.  #6/4RF return annually or prn

## 2016-04-18 LAB — VITAMIN D 25 HYDROXY (VIT D DEFICIENCY, FRACTURES): VIT D 25 HYDROXY: 44 ng/mL (ref 30–100)

## 2016-04-22 LAB — IPS PAP TEST WITH HPV

## 2016-04-23 ENCOUNTER — Ambulatory Visit (AMBULATORY_SURGERY_CENTER): Payer: 59 | Admitting: Gastroenterology

## 2016-04-23 ENCOUNTER — Encounter: Payer: Self-pay | Admitting: Gastroenterology

## 2016-04-23 VITALS — BP 120/67 | HR 51 | Temp 98.0°F | Resp 16 | Ht 66.5 in | Wt 138.0 lb

## 2016-04-23 DIAGNOSIS — Z860100 Personal history of colon polyps, unspecified: Secondary | ICD-10-CM

## 2016-04-23 DIAGNOSIS — I471 Supraventricular tachycardia: Secondary | ICD-10-CM | POA: Diagnosis not present

## 2016-04-23 MED ORDER — SODIUM CHLORIDE 0.9 % IV SOLN: 500.0000 mL | INTRAVENOUS | Status: DC

## 2016-04-23 NOTE — Progress Notes (Signed)
Report to PACU, RN, vss, BBS= Clear.  

## 2016-04-23 NOTE — Op Note (Signed)
[facility] Patient Name: Teresa Fitzgerald Procedure Date: 04/23/2016 8:45 AM MRN: [id number] Endoscopist: Rachael Fee , MD Age: 60 Referring MD:  Date of Birth: 05-23-1956 Gender: Female Account #: [id number] Procedure:                Colonoscopy Indications:              High risk colon cancer surveillance: Personal                            history of colonic polyps; colonoscopy Dr.                            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 The patient's tolerance of                            previous anesthesia was also reviewed. The risks                            and benefits of the procedure and the sedation                            options and risks were discussed with the patient.                            All questions were answered, and informed consent                            was obtained. Prior Anticoagulants: The patient has                            taken no previous anticoagulant or antiplatelet                            agents. ASA Grade Assessment: II - A patient with                            mild systemic disease. After reviewing the risks  and benefits, the patient was deemed in                            satisfactory condition to undergo the procedure.                           After obtaining informed consent, the colonoscope                            was passed under direct vision. Throughout the                            procedure, the patient's blood pressure, pulse, and                            oxygen saturations were monitored continuously. The           Model PCF-H190L [id number]) scope was introduced                            through the anus and advanced to the the cecum,                            identified by appendiceal orifice and ileocecal                            valve. The colonoscopy was performed without                            difficulty. The patient tolerated the procedure                            well. The quality of the bowel preparation was                            excellent. The ileocecal valve, appendiceal                            orifice, and rectum were photographed. Scope In: 8:51:48 AM Scope Out: 9:02:02 AM Scope Withdrawal Time: 0 hours 6 minutes 9 seconds  Total Procedure Duration: 0 hours 10 minutes 14 seconds  Findings:                 The entire examined colon appeared normal on direct                            and retroflexion views. Complications:            No immediate complications. Estimated blood loss:                            None. Estimated Blood Loss:     Estimated blood loss: none. Impression:               - The entire examined colon is normal on direct and  retroflexion views.                           - No polyps or cancers Recommendation:           - Patient has a contact number available for                            emergencies. The signs and symptoms of potential                            delayed complications were discussed with the                            patient. Return to normal activities tomorrow.                            Written discharge instructions were provided to the                            patient.                           - Resume previous diet.                           - Continue present medications.                           - Repeat colonoscopy in 10 years for screening                            purposes. There is no need for colon cancer                            screening by any method (including stool  testing)                            prior to then. Rachael Fee, MD 04/23/2016 9:04:20 AM This report has been signed electronically.

## 2016-04-23 NOTE — Patient Instructions (Signed)
YOU HAD AN ENDOSCOPIC PROCEDURE TODAY AT THE [facility]:   Refer to the procedure report that was given to you for any specific questions about what was found during the examination.  If the procedure report does not answer your questions, please call your gastroenterologist to clarify.  If you requested that your care partner not be given the details of your procedure findings, then the procedure report has been included in a sealed envelope for you to review at your convenience later.  YOU SHOULD EXPECT: Some feelings of bloating in the abdomen. Passage of more gas than usual.  Walking can help get rid of the air that was put into your GI tract during the procedure and reduce the bloating. If you had a lower endoscopy (such as a colonoscopy or flexible sigmoidoscopy) you may notice spotting of blood in your stool or on the toilet paper. If you underwent a bowel prep for your procedure, you may not have a normal bowel movement for a few days.  Please Note:  You might notice some irritation and congestion in your nose or some drainage.  This is from the oxygen used during your procedure.  There is no need for concern and it should clear up in a day or so.  SYMPTOMS TO REPORT IMMEDIATELY:   Following lower endoscopy (colonoscopy or flexible sigmoidoscopy):  Excessive amounts of blood in the stool  Significant tenderness or worsening of abdominal pains  Swelling of the abdomen that is new, acute  Fever of 100F or higher   For urgent or emergent issues, a gastroenterologist can be reached at any hour by calling [phone].   DIET: Your first meal following the procedure should be a small meal and then it is ok to progress to your normal diet. Heavy or fried foods are harder to digest and may make you feel nauseous or bloated.  Likewise, meals heavy in dairy and vegetables can increase bloating.  Drink plenty of fluids but you should avoid alcoholic beverages for 24  hours.  ACTIVITY:  You should plan to take it easy for the rest of today and you should NOT DRIVE or use heavy machinery until tomorrow (because of the sedation medicines used during the test).    FOLLOW UP: Our staff will call the number listed on your records the next business day following your procedure to check on you and address any questions or concerns that you may have regarding the information given to you following your procedure. If we do not reach you, we will leave a message.  However, if you are feeling well and you are not experiencing any problems, there is no need to return our call.  We will assume that you have returned to your regular daily activities without incident.  If any biopsies were taken you will be contacted by phone or by letter within the next 1-3 weeks.  Please call us at [phone] if you have not heard about the biopsies in 3 weeks.    SIGNATURES/CONFIDENTIALITY: You and/or your care partner have signed paperwork which will be entered into your electronic medical record.  These signatures attest to the fact that that the information above on your After Visit Summary has been reviewed and is understood.  Full responsibility of the confidentiality of this discharge information lies with you and/or your care-partner. 

## 2016-04-24 ENCOUNTER — Telehealth: Payer: Self-pay

## 2016-04-24 NOTE — Telephone Encounter (Signed)
  Follow up Call-  Call back number 04/23/2016  Post procedure Call Back phone  # [phone]  Permission to leave phone message Yes  Some recent data might be hidden     Patient questions:  Do you have a fever, pain , or abdominal swelling? No. Pain Score  0 *  Have you tolerated food without any problems? Yes.    Have you been able to return to your normal activities? Yes.    Do you have any questions about your discharge instructions: Diet   No. Medications  No. Follow up visit  No.  Do you have questions or concerns about your Care? No.  Actions: * If pain score is 4 or above: No action needed, pain <4.

## 2016-06-30 ENCOUNTER — Encounter: Payer: Self-pay | Admitting: Internal Medicine

## 2016-07-01 ENCOUNTER — Other Ambulatory Visit: Payer: Self-pay | Admitting: Internal Medicine

## 2016-07-01 MED FILL — CARTIA XT 120 MG CAPSULE SA: 120 | 90 days supply | Qty: 90 | Fill #0

## 2016-07-02 MED FILL — VIT D2 1.25 MG (50,000 UNIT: 1.25 MG | 84 days supply | Qty: 12 | Fill #0

## 2016-07-14 ENCOUNTER — Encounter: Payer: Self-pay | Admitting: Internal Medicine

## 2016-07-14 ENCOUNTER — Ambulatory Visit (INDEPENDENT_AMBULATORY_CARE_PROVIDER_SITE_OTHER): Payer: 59 | Admitting: Internal Medicine

## 2016-07-14 VITALS — BP 122/78 | HR 60 | Ht 66.75 in | Wt 137.2 lb

## 2016-07-14 DIAGNOSIS — I471 Supraventricular tachycardia: Secondary | ICD-10-CM | POA: Diagnosis not present

## 2016-07-14 DIAGNOSIS — Z79899 Other long term (current) drug therapy: Secondary | ICD-10-CM | POA: Diagnosis not present

## 2016-07-14 NOTE — Patient Instructions (Signed)
Medication Instructions:  Your physician recommends that you continue on your current medications as directed. Please refer to the Current Medication list given to you today.    Labwork: Your physician recommends that you return for lab work today: lipid/liver    Testing/Procedures: None ordered   Follow-Up: Your physician wants you to follow-up in: 12 months with Dr Johney FrameAllred Bonita QuinYou will receive a reminder letter in the mail two months in advance. If you don't receive a letter, please call our office to schedule the follow-up appointment.   Any Other Special Instructions Will Be Listed Below (If Applicable).     If you need a refill on your cardiac medications before your next appointment, please call your pharmacy.

## 2016-07-14 NOTE — Progress Notes (Signed)
PCP: Delorse Lek, MD  The patient presents today for routine electrophysiology followup.  Since last being seen in our clinic, the patient reports doing very well.   Her SVT is well controlled with cardizem. She is taking herbal classes.  She also does yoga.  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 Today, she denies symptoms of palpitations, chest pain,orthopnea, PND, dizziness, presyncope, syncope, or neurologic sequela.  The patient feels that she is tolerating medications without difficulties and is otherwise without complaint today.   Past Medical History:  Diagnosis Date  . Allergic rhinitis   . Asthma   . Hypercholesteremia   . Mitral valve prolapse   . Osteopenia   . SVT (supraventricular tachycardia) (HCC)    presumed atrial tachycardia not inducible on EPS 12/11   Past Surgical History:  Procedure Laterality Date  . EP study  12/11   by JA, no inducible arrhythmia  . LUMBAR FUSION  2000  . TUBAL LIGATION  1988    Current Outpatient Prescriptions  Medication Sig Dispense Refill  . aspirin 81 MG tablet Take 81 mg by mouth daily.      . Calcium Carbonate (CALCARB 600) 1500 MG TABS Take 1 tablet by mouth 2 (two) times daily.      Marland Kitchen CARTIA XT 120 MG 24 hr capsule TAKE 1 CAPSULE BY MOUTH ONCE DAILY 90 capsule 0  . Coenzyme Q10 (COQ10 PO) Take 1 tablet by mouth daily.      . Mometasone Furo-Formoterol Fum 200-5 MCG/ACT AERO as directed. inhale 2 puffs into the lungs first thing in the morning once as needed    . pravastatin (PRAVACHOL) 40 MG tablet TAKE 1 TABLET BY MOUTH DAILY AT BEDTIME 30 tablet 5  . vitamin B-12 (CYANOCOBALAMIN) 1000 MCG tablet Take 1,000 mcg by mouth daily.    . Vitamin D, Ergocalciferol, (DRISDOL) 50000 units CAPS capsule Take 1 capsule (50,000 Units total) by mouth every 7 (seven) days. 12 capsule 4   Current Facility-Administered Medications  Medication Dose Route Frequency Provider Last Rate Last Dose  . 0.9 %  sodium chloride infusion  500 mL  Intravenous Continuous Rachael Fee, MD        Allergies  Allergen Reactions  . Oatmeal     unknown  . Other     ALL Tree nuts - swelling of mouth and throat, hives    Social History   Social History  . Marital status: Married    Spouse name: N/A  . Number of children: 3  . Years of education: N/A   Occupational History  . 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 Smoking status: Never Smoker  . Smokeless tobacco: Never Used  . Alcohol use 8.4 oz/week    14 Standard drinks or equivalent per week     Comment: wine  . Drug use: No  . Sexual activity: Yes    Partners: Male    Birth control/ protection: Surgical, Post-menopausal     Comment: BTL   Other Topics Concern  . Not on file   Social History Narrative  . No narrative on file   ROS0- all systems are reviewed and negative except as per HPI above  Family History  Problem Relation Age of Onset  . Asthma Father   . Allergies Father   . Asthma Paternal Grandfather   . Lung cancer Mother     smoked  . Colon  cancer Neg Hx    Physical Exam: Vitals:   07/14/16 1548  BP: 122/78  Pulse: 60  Weight: 137 lb 4 oz (62.3 kg)  Height: 5' 6.75" (1.695 m)    GEN- The patient is well appearing, alert and oriented x 3 today.   Head- normocephalic, atraumatic Eyes-  Sclera clear, conjunctiva pink Ears- hearing intact Oropharynx- clear Neck- supple, no JVP Lymph- no cervical lymphadenopathy Lungs- Clear to ausculation bilaterally, normal work of breathing Heart- Regular rate and rhythm, no murmurs, rubs or gallops, PMI not laterally displaced GI- soft, NT, ND, + BS Extremities- no clubbing, cyanosis, trivial edema  ekg- sinus rhythm 60 bpm, otherwise normal ekg Echo 2015 is reviewed   Assessment and Plan:  1. SVT Well controlled No changes today She did not tolerate higher doses of diltiazem due to leg swelling Tachycardia not inducible on prior  eps  2. HL Fasting lipids and LFTs today  Return in 1 year  Hillis RangeJames Macaulay Reicher MD, [facility] 07/14/2016 4:05 PM

## 2016-07-15 LAB — HEPATIC FUNCTION PANEL
ALK PHOS: 79 U/L (ref 33–130)
ALT: 14 U/L (ref 6–29)
AST: 15 U/L (ref 10–35)
Albumin: 4.6 g/dL (ref 3.6–5.1)
BILIRUBIN INDIRECT: 0.7 mg/dL (ref 0.2–1.2)
Bilirubin, Direct: 0.1 mg/dL (ref <=0.2)
TOTAL PROTEIN: 6.8 g/dL (ref 6.1–8.1)
Total Bilirubin: 0.8 mg/dL (ref 0.2–1.2)

## 2016-07-15 LAB — LIPID PANEL
CHOLESTEROL: 167 mg/dL (ref 125–200)
HDL: 48 mg/dL (ref >=46)
LDL CALC: 104 mg/dL (ref <130)
TRIGLYCERIDES: 76 mg/dL (ref <150)
Total CHOL/HDL Ratio: 3.5 Ratio (ref <=5.0)
VLDL: 15 mg/dL (ref <30)

## 2016-09-30 ENCOUNTER — Other Ambulatory Visit: Payer: Self-pay | Admitting: Internal Medicine

## 2016-09-30 MED FILL — VIT D2 1.25 MG (50,000 UNIT: 1.25 MG | 84 days supply | Qty: 12 | Fill #1

## 2016-09-30 MED FILL — CARTIA XT 120 MG CAPSULE SA: 120 | 90 days supply | Qty: 90 | Fill #0

## 2016-12-25 MED FILL — VIT D2 1.25 MG (50,000 UNIT: 1.25 MG | 84 days supply | Qty: 12 | Fill #2

## 2016-12-31 MED FILL — CARTIA XT 120 MG CAPSULE SA: 120 | 90 days supply | Qty: 90 | Fill #1

## 2017-03-13 DIAGNOSIS — M25511 Pain in right shoulder: Secondary | ICD-10-CM | POA: Diagnosis not present

## 2017-03-19 MED FILL — VIT D2 1.25 MG (50,000 UNIT: 1.25 MG | 84 days supply | Qty: 12 | Fill #3

## 2017-03-26 DIAGNOSIS — M542 Cervicalgia: Secondary | ICD-10-CM | POA: Diagnosis not present

## 2017-03-26 DIAGNOSIS — G8929 Other chronic pain: Secondary | ICD-10-CM | POA: Diagnosis not present

## 2017-03-26 DIAGNOSIS — M898X1 Other specified disorders of bone, shoulder: Secondary | ICD-10-CM | POA: Diagnosis not present

## 2017-03-26 MED FILL — predniSONE 10 MG TABS: 10 | 12 days supply | Qty: 48 | Fill #0

## 2017-03-26 MED FILL — traMADol HCL 50 MG TABS: 50 | 5 days supply | Qty: 60 | Fill #0

## 2017-03-26 MED FILL — METHOCARBAMOL 500 MG TABLET: 500 | 15 days supply | Qty: 60 | Fill #0

## 2017-03-27 ENCOUNTER — Encounter: Payer: Self-pay | Admitting: Family Medicine

## 2017-03-27 ENCOUNTER — Ambulatory Visit (INDEPENDENT_AMBULATORY_CARE_PROVIDER_SITE_OTHER): Payer: 59 | Admitting: Family Medicine

## 2017-03-27 VITALS — BP 133/74 | HR 73 | Temp 98.2°F | Resp 17 | Ht 66.5 in | Wt 139.0 lb

## 2017-03-27 DIAGNOSIS — J301 Allergic rhinitis due to pollen: Secondary | ICD-10-CM | POA: Diagnosis not present

## 2017-03-27 DIAGNOSIS — M25511 Pain in right shoulder: Secondary | ICD-10-CM

## 2017-03-27 DIAGNOSIS — J4540 Moderate persistent asthma, uncomplicated: Secondary | ICD-10-CM

## 2017-03-27 DIAGNOSIS — E7800 Pure hypercholesterolemia, unspecified: Secondary | ICD-10-CM

## 2017-03-27 DIAGNOSIS — Z8679 Personal history of other diseases of the circulatory system: Secondary | ICD-10-CM

## 2017-03-27 DIAGNOSIS — I471 Supraventricular tachycardia: Secondary | ICD-10-CM | POA: Diagnosis not present

## 2017-03-27 MED ORDER — ALBUTEROL SULFATE HFA 108 (90 BASE) MCG/ACT IN AERS: 2.0000 | INHALATION_SPRAY | Freq: Four times a day (QID) | RESPIRATORY_TRACT | 1 refills | Status: DC | PRN

## 2017-03-27 MED ORDER — MOMETASONE FURO-FORMOTEROL FUM 200-5 MCG/ACT IN AERO: 2.0000 | INHALATION_SPRAY | Freq: Two times a day (BID) | RESPIRATORY_TRACT | 5 refills | Status: DC

## 2017-03-27 MED FILL — VENTOLIN HFA 90 MCG INHALER: 108 (90 BAS | 25 days supply | Qty: 18 | Fill #0

## 2017-03-27 NOTE — Patient Instructions (Signed)
     IF you received an x-ray today, you will receive an invoice from [facility]. Please contact [facility] at [phone] with questions or concerns regarding your invoice.   IF you received labwork today, you will receive an invoice from [facility]. Please contact [facility] at [phone] with questions or concerns regarding your invoice.   Our billing staff will not be able to assist you with questions regarding bills from these companies.  You will be contacted with the lab results as soon as they are available. The fastest way to get your results is to activate your My Chart account. Instructions are located on the last page of this paperwork. If you have not heard from us regarding the results in 2 weeks, please contact this office.     

## 2017-03-27 NOTE — Progress Notes (Signed)
Subjective:    Patient ID: Rosealee AlbeeMarie A Reichow, female    DOB: 12/22/1955, 61 y.o.   MRN: [id number]  03/27/2017  Establish Care   HPI This 61 y.o. female presents to establish care.  Regular doctor retired in August 2018.  Having R shoulder pain.  S/p orthopedist care; Dr. Ranell PatrickNorris.  Physical threapy and prednisone.  No neck pain.   Last physical: 2016 Pap smear:  04/17/2016 Miller/gynecology Mammogram:  02/23/2015 Colonoscopy:  04/23/2016 Bone density:  2016; repeat this year; hopes to retire early. Eye exam:  Nile RiggsShapiro; no glasses or contacts; no c/g.  Lasik on one eye. Dental exam:  Goes regularly; every six months; dentist retired. [employer] [facility] [employer].    Asthma: Dulera PRN.  Last consultation by Sherene SiresWert.  Allergy related.  Chromulin sodium.  Great for allergies.  PFTs are great.   Allergic Rhinitis: Zyrtec works really well.  Herbal supplement.  SVT: followed by cardiology; occurs every day; cardizem helps.  Considered ablation.  Short durations of SVT.  Wore Holter monitor.    Hypercholesterolemia: Patient reports good compliance with medication, good tolerance to medication, and good symptom control.    Vegan: genetic.    Dr. Ernesta AmbleBrett Burnette.    BP Readings from Last 3 Encounters:  03/27/17 133/74  07/14/16 122/78  04/23/16 120/67   Wt Readings from Last 3 Encounters:  03/27/17 139 lb (63 kg)  07/14/16 137 lb 4 oz (62.3 kg)  04/23/16 138 lb (62.6 kg)    There is no immunization history on file for this patient.   Review of Systems  Constitutional: Negative for chills, diaphoresis, fatigue and fever.  Eyes: Negative for visual disturbance.  Respiratory: Negative for cough and shortness of breath.   Cardiovascular: Negative for chest pain, palpitations and leg swelling.  Gastrointestinal: Negative for abdominal pain, constipation, diarrhea, nausea and vomiting.  Endocrine: Negative for cold intolerance, heat intolerance, polydipsia, polyphagia and polyuria.  Neurological:  Negative for dizziness, tremors, seizures, syncope, facial asymmetry, speech difficulty, weakness, light-headedness, numbness and headaches.    Past Medical History:  Diagnosis Date  . Allergic rhinitis   . Asthma    onset childhood; avoids trigger; Wert consultatoin in 2014. Dulera prescribed.  . Hypercholesteremia   . Mitral valve prolapse   . Osteopenia   . SVT (supraventricular tachycardia) (HCC)    presumed atrial tachycardia not inducible on EPS 12/11   Past Surgical History:  Procedure Laterality Date  . EP study  12/11   by JA, no inducible arrhythmia  . LUMBAR FUSION  2000  . TUBAL LIGATION  1988   Allergies  Allergen Reactions  . Oatmeal     unknown  . Other     ALL Tree nuts - swelling of mouth and throat, hives    Social History   Social History  . Marital status: Married    Spouse name: N/A  . Number of children: 3  . Years of education: N/A   Occupational History  . 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 Smoking status: Never Smoker  . Smokeless tobacco: Never Used  . Alcohol use 8.4 oz/week    14 Standard drinks or equivalent per week     Comment: wine  . Drug use: No  . Sexual activity: Yes    Partners: Male    Birth control/ protection: Surgical, Post-menopausal     Comment: BTL   Other Topics Concern  . 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  Walking 1.5-2.0 miles per day.   Family History  Problem Relation Age of Onset  . Asthma Father   . Allergies Father   . Asthma Paternal Grandfather   . Lung cancer Mother        smoked  . Hypertension Mother   .  Allergies Daughter   . Colon cancer Neg Hx        Objective:    BP 133/74   Pulse 73   Temp 98.2 F (36.8 C) (Oral)   Resp 17   Ht 5' 6.5" (1.689 m)   Wt 139 lb (63 kg)   LMP 09/22/2008 (Approximate)   SpO2 98%   BMI 22.10 kg/m  Physical Exam  Constitutional: She is oriented to person, place, and time. She appears well-developed and well-nourished. No distress.  HENT:  Head: Normocephalic and atraumatic.  Right Ear: External ear normal.  Left Ear: External ear normal.  Nose: Nose normal.  Mouth/Throat: Oropharynx is clear and moist.  Eyes: Conjunctivae and EOM are normal. Pupils are equal, round, and reactive to light.  Neck: Normal range of motion. Neck supple. Carotid bruit is not present. No thyromegaly present.  Cardiovascular: Normal rate, regular rhythm, normal heart sounds and intact distal pulses.  Exam reveals no gallop and no friction rub.   No murmur heard. Pulmonary/Chest: Effort normal and breath sounds normal. She has no wheezes. She has no rales.  Abdominal: Soft. Bowel sounds are normal. She exhibits no distension and no mass. There is no tenderness. There is no rebound and no guarding.  Lymphadenopathy:    She has no cervical adenopathy.  Neurological: She is alert and oriented to person, place, and time. No cranial nerve deficit.  Skin: Skin is warm and dry. No rash noted. She is not diaphoretic. No erythema. No pallor.  Psychiatric: She has a normal mood and affect. Her behavior is normal.    Depression screen PHQ 2/9 03/27/2017  Decreased Interest 0  Down, Depressed, Hopeless 0  PHQ - 2 Score 0   Fall Risk  03/27/2017  Falls in the past year? No       Assessment & Plan:   1. Moderate persistent asthma without complication   2. SVT/ PSVT/ PAT    -stable chronic medical conditions; continue current medications; refills provided. -s/p orthopedic consultation for R shoulder pain; continue current treatment plan per ortho. -followed by cardiology for  SVT; considering ablation due to ongoing daily symptoms.   No orders of the defined types were placed in this encounter.  Meds ordered this encounter  Medications  . predniSONE (DELTASONE) 10 MG tablet    Sig: Take 10 mg by mouth daily with breakfast.  . methocarbamol (ROBAXIN) 500 MG tablet    Sig: Take 500 mg by mouth 4 (four) times daily.  Marland Kitchen albuterol (PROVENTIL HFA;VENTOLIN HFA) 108 (90 Base) MCG/ACT inhaler    Sig: Inhale 2 puffs into the lungs every 6 (six) hours as needed.    Dispense:  1 Inhaler  Refill:  1  . mometasone-formoterol (DULERA) 200-5 MCG/ACT AERO    Sig: Inhale 2 puffs into the lungs 2 (two) times daily. inhale 2 puffs into the lungs first thing in the morning once as needed    Dispense:  13 g    Refill:  5    No Follow-up on file.   Rosalind Guido Paulita Fujita, M.D. Primary Care at [facility] previously [facility] [address] [place], [place]  [phone] phone [phone] fax

## 2017-04-02 MED FILL — CARTIA XT 120 MG CAPSULE SA: 120 | 90 days supply | Qty: 90 | Fill #2

## 2017-04-03 MED FILL — DULERA 200 MCG/5 MCG INH: 200-5 | 30 days supply | Qty: 13 | Fill #0

## 2017-04-09 DIAGNOSIS — M542 Cervicalgia: Secondary | ICD-10-CM | POA: Diagnosis not present

## 2017-04-11 DIAGNOSIS — J301 Allergic rhinitis due to pollen: Secondary | ICD-10-CM | POA: Insufficient documentation

## 2017-04-14 DIAGNOSIS — M542 Cervicalgia: Secondary | ICD-10-CM | POA: Diagnosis not present

## 2017-04-22 DIAGNOSIS — M542 Cervicalgia: Secondary | ICD-10-CM | POA: Diagnosis not present

## 2017-05-14 ENCOUNTER — Encounter: Payer: Self-pay | Admitting: Family Medicine

## 2017-05-14 ENCOUNTER — Ambulatory Visit (INDEPENDENT_AMBULATORY_CARE_PROVIDER_SITE_OTHER): Payer: 59 | Admitting: Family Medicine

## 2017-05-14 VITALS — BP 136/78 | HR 81 | Temp 99.7°F | Resp 16 | Ht 67.0 in | Wt 137.0 lb

## 2017-05-14 DIAGNOSIS — N3001 Acute cystitis with hematuria: Secondary | ICD-10-CM

## 2017-05-14 DIAGNOSIS — R300 Dysuria: Secondary | ICD-10-CM | POA: Diagnosis not present

## 2017-05-14 LAB — POC URINALSYSI DIPSTICK (AUTOMATED)
BILIRUBIN UA: NEGATIVE
GLUCOSE UA: NEGATIVE
KETONES UA: NEGATIVE
Nitrite, UA: NEGATIVE
SPEC GRAV UA: 1.01 (ref 1.010–1.025)
Urobilinogen, UA: 0.2 E.U./dL
pH, UA: 8.5 — AB (ref 5.0–8.0)

## 2017-05-14 MED ORDER — SULFAMETHOXAZOLE-TRIMETHOPRIM 800-160 MG PO TABS: 1.0000 | ORAL_TABLET | Freq: Two times a day (BID) | ORAL | 0 refills | Status: DC

## 2017-05-14 MED FILL — SULFAMETHOXAZOLE/TMP DS TAB: 800-160 | 10 days supply | Qty: 20 | Fill #0

## 2017-05-14 NOTE — Progress Notes (Signed)
Subjective:  By signing my name below, I, [name], attest that this documentation has been prepared under the direction and in the presence of Meredith Staggers, MD. Electronically Signed: [name], Scribe. 05/14/2017 , 9:54 AM .  Patient was seen in Room 25 .   Patient ID: Teresa Fitzgerald, female    DOB: 04-07-1956, 61 y.o.   MRN: [id number] Chief Complaint  Patient presents with  . Dysuria    frequency since Sunday no discharge   HPI Teresa Fitzgerald is a 61 y.o. female  Patient is here for urinary frequency for the past 4 days. She was evaluated in Jan and April of last year on chart review at other practices; she was initially treated with Keflex, and then Septra in April. Her previous PCP was Dr. Doristine Counter at [facility] in [place].   She recently returned from a camping trip at [place]. She has had chronic UTI in the past, so she immediately started on OTC pyridium and drinking plenty of water. She describes having a spasm sensation, similar to "needing to go". She has some associated nausea. She denies back pain or measured fever.   Patient Active Problem List   Diagnosis Date Noted  . Seasonal allergic rhinitis due to pollen 04/11/2017  . SVT/ PSVT/ PAT 12/28/2009  . Asthma 12/27/2009  . History of cardiovascular disorder 10/29/2009  . HYPERCHOLESTEROLEMIA 10/26/2009   Past Medical History:  Diagnosis Date  . Allergic rhinitis   . Asthma    onset childhood; avoids trigger; [facility]ert consultatoin in 2014. Dulera prescribed.  . Hypercholesteremia   . Mitral valve prolapse   . Osteopenia   . SVT (supraventricular tachycardia) (HCC)    presumed atrial tachycardia not inducible on EPS 12/11   Past Surgical History:  Procedure Laterality Date  . EP study  12/11   by JA, no inducible arrhythmia  . LUMBAR FUSION  2000  . TUBAL LIGATION  1988   Allergies  Allergen Reactions  . Oatmeal     unknown  . Other     ALL Tree nuts - swelling of mouth and throat, hives    Prior to Admission medications   Medication Sig Start Date End Date Taking? Authorizing Provider  albuterol (PROVENTIL HFA;VENTOLIN HFA) 108 (90 Base) MCG/ACT inhaler Inhale 2 puffs into the lungs every 6 (six) hours as needed. 03/27/17   Ethelda Chick, MD  aspirin 81 MG tablet Take 81 mg by mouth daily.      [provider]  Calcium Carbonate (CALCARB 600) 1500 MG TABS Take 1 tablet by mouth 2 (two) times daily.      [provider]  CARTIA XT 120 MG 24 hr capsule TAKE 1 CAPSULE BY MOUTH ONCE DAILY 09/30/16   Allred, Fayrene Fearing, MD  methocarbamol (ROBAXIN) 500 MG tablet Take 500 mg by mouth 4 (four) times daily.    [provider]  mometasone-formoterol (DULERA) 200-5 MCG/ACT AERO Inhale 2 puffs into the lungs 2 (two) times daily. inhale 2 puffs into the lungs first thing in the morning once as needed 03/27/17   Ethelda Chick, MD  pravastatin (PRAVACHOL) 40 MG tablet TAKE 1 TABLET BY MOUTH DAILY AT BEDTIME 01/02/16   Allred, Fayrene Fearing, MD  predniSONE (DELTASONE) 10 MG tablet Take 10 mg by mouth daily with breakfast.    [provider]  vitamin B-12 (CYANOCOBALAMIN) 1000 MCG tablet Take 1,000 mcg by mouth daily.    [provider]  Vitamin D, Ergocalciferol, (DRISDOL) 50000 units  CAPS capsule Take 1 capsule (50,000 Units total) by mouth every 7 (seven) days. 04/17/16   Jerene Bears, MD   Social History   Social History  . Marital status: Married    Spouse name: N/A  . Number of children: 3  . Years of education: N/A   Occupational History  . RN [employer]    case manager for [employer]   Social History Main Topics  . Smoking status: Never Smoker  . Smokeless tobacco: Never Used  . Alcohol use 8.4 oz/week    14 Standard drinks or equivalent per week     Comment: wine  . Drug use: No  . Sexual activity: Yes    Partners: Male    Birth control/ protection: Surgical, Post-menopausal     Comment: BTL   Other Topics Concern  . 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  Walking 1.5-2.0 miles per day.   Review of Systems  Constitutional: Negative for appetite change, chills, fatigue, fever and unexpected weight change.  Gastrointestinal: Positive for nausea. Negative for abdominal pain, constipation, diarrhea and vomiting.  Genitourinary: Positive for frequency. Negative for vaginal discharge.  Musculoskeletal: Negative for back pain and myalgias.       Objective:   Physical Exam  Constitutional: She is oriented to person, place, and time. She appears well-developed and well-nourished. No distress.  HENT:  Head: Normocephalic and atraumatic.  Eyes: Pupils are equal, round, and reactive to light. EOM are normal.  Neck: Neck supple.  Cardiovascular: Normal rate.   Pulmonary/Chest: Effort normal. No respiratory distress.  Abdominal: There is tenderness (minimal) in the suprapubic area. There is no rebound, no guarding and no CVA tenderness.  Musculoskeletal: Normal range of motion.  Neurological: She is alert and oriented to person, place, and time.  Skin: Skin is warm and dry.  Psychiatric: She has a normal mood and affect. Her behavior is normal.  Nursing note and vitals reviewed.   Vitals:   05/14/17 0934  BP: 136/78  Pulse: 81  Resp: 16  Temp: 99.7 F (37.6 C)  TempSrc: Oral  SpO2: 95%  Weight: 137 lb (62.1 kg)  Height: 5\' 7"  (1.702 m)   Results for orders placed or performed in visit on 05/14/17  POCT Urinalysis Dipstick (Automated)  Result Value Ref Range   Color, UA yellow    Clarity, UA cloudy    Glucose, UA negative    Bilirubin, UA negative    Ketones, UA negative     Spec Grav, UA 1.010 1.010 - 1.025   Blood, UA trace - intact    pH, UA 8.5 (A) 5.0 - 8.0   Protein, UA trace    Urobilinogen, UA 0.2 0.2 or 1.0 E.U./dL   Nitrite, UA negative    Leukocytes, UA Moderate (2+) (A) Negative       Assessment & Plan:    Teresa Fitzgerald is a 61 y.o. female Dysuria - Plan: POCT Urinalysis Dipstick (Automated), Urine Culture  Acute cystitis with hematuria History recurrent UTI, but  has been doing well recently. He appears to have acute UTI, without apparent symptoms of pyelonephritis at this point. Borderline temp but no true fever, no CVA tenderness.   - Septra DS for 10 days, check urine culture, push fluids, RTC precautions.   Meds ordered this encounter  Medications  . sulfamethoxazole-trimethoprim (BACTRIM DS,SEPTRA DS) 800-160 MG tablet    Sig: Take 1 tablet by mouth 2 (two) times daily.    Dispense:  20 tablet    Refill:  0   Patient Instructions    Continue to drink plenty of fluids, Pyridium over-the-counter as needed. Start Septra one pill twice per day for the infection. I will check a  urine culture and let you know if any changes are needed. If you are not improving in the next few days, or any worsening including fever, or worse nausea or vomiting, please return for recheck.    Urinary Tract Infection, Adult A urinary tract infection (UTI) is an infection of any part of the urinary tract, which includes the kidneys, ureters, bladder, and urethra. These organs make, store, and get rid of urine in the body. UTI can be a bladder infection (cystitis) or kidney infection (pyelonephritis). What are the causes? This infection may be caused by fungi, viruses, or bacteria. Bacteria are the most common cause of UTIs. This condition can also be caused by repeated incomplete emptying of the bladder during urination. What increases the risk? This condition is more likely to develop if:  You ignore your need to urinate or hold urine for long periods of  time.  You do not empty your bladder completely during urination.  You wipe back to front after urinating or having a bowel movement, if you are female.  You are uncircumcised, if you are female.  You are constipated.  You have a urinary catheter that stays in place (indwelling).  You have a weak defense (immune) system.  You have a medical condition that affects your bowels, kidneys, or bladder.  You have diabetes.  You take antibiotic medicines frequently or for long periods of time, and the antibiotics no longer work well against certain types of infections (antibiotic resistance).  You take medicines that irritate your urinary tract.  You are exposed to chemicals that irritate your urinary tract.  You are female.  What are the signs or symptoms? Symptoms of this condition include:  Fever.  Frequent urination or passing small amounts of urine frequently.  Needing to urinate urgently.  Pain or burning with urination.  Urine that smells bad or unusual.  Cloudy urine.  Pain in the lower abdomen or back.  Trouble urinating.  Blood in the urine.  Vomiting or being less hungry than normal.  Diarrhea or abdominal pain.  Vaginal discharge, if you are female.  How is this diagnosed? This condition is diagnosed with a medical history and physical exam. You will also need to provide a urine sample to test your urine. Other tests may be done, including:  Blood tests.  Sexually transmitted disease (STD) testing.  If you have had more than one UTI, a cystoscopy or imaging studies may be done to determine the cause of the infections. How is this treated? Treatment for this condition often includes a combination of two or more of the following:  Antibiotic medicine.  Other medicines to treat less common causes of UTI.  Over-the-counter medicines to treat pain.  Drinking enough water to stay hydrated.  Follow these instructions at home:  Take over-the-counter  and prescription medicines only as told by your health care provider.  If you were prescribed an antibiotic, take it as told by your health care provider. Do not stop taking the antibiotic even if you start to feel better.  Avoid alcohol, caffeine, tea, and carbonated beverages. They can irritate your bladder.  Drink enough fluid to keep your urine clear or pale yellow.  Keep all follow-up visits as told by your health care provider. This is important.  Make sure to: ? Empty your bladder often and completely. Do not hold urine for long periods of time. ? Empty your bladder before and after sex. ? Wipe from front to back after a bowel movement if you are female. Use each tissue one time when you wipe. Contact a health care provider if:  You have back pain.  You have a fever.  You feel nauseous or vomit.  Your symptoms do not get better after 3 days.  Your symptoms go away and then return. Get help right away if:  You have severe back pain or lower abdominal pain.  You are vomiting and cannot keep down any medicines or water. This information is not intended to replace advice given to you by your health care provider. Make sure you discuss any questions you have with your health care provider. Document Released: 06/18/2005 Document Revised: 02/20/2016 Document Reviewed: 07/30/2015 Elsevier Interactive Patient Education  2017 [employer].     IF you received an x-ray today, you will receive an invoice from [facility]. Please contact [facility] at [phone] with questions or concerns regarding your invoice.   IF you received labwork today, you will receive an invoice from [place]. Please contact [facility] at [phone] with questions or concerns regarding your invoice.   Our billing staff will not be able to assist you with questions regarding bills from these companies.  You will be contacted with the lab results as soon as they are available. The  fastest way to get your results is to activate your My Chart account. Instructions are located on the last page of this paperwork. If you have not heard from [place] regarding the results in 2 weeks, please contact this office.       I personally performed the services described in this documentation, which was scribed in my presence. The recorded information has been reviewed and considered for accuracy and completeness, addended by me as needed, and agree with information above.  Signed,   Meredith Staggers, MD [facility].  05/14/17 9:59 AM

## 2017-05-14 NOTE — Patient Instructions (Addendum)
Continue to drink plenty of fluids, Pyridium over-the-counter as needed. Start Septra one pill twice per day for the infection. I will check a  urine culture and let you know if any changes are needed. If you are not improving in the next few days, or any worsening including fever, or worse nausea or vomiting, please return for recheck.    Urinary Tract Infection, Adult A urinary tract infection (UTI) is an infection of any part of the urinary tract, which includes the kidneys, ureters, bladder, and urethra. These organs make, store, and get rid of urine in the body. UTI can be a bladder infection (cystitis) or kidney infection (pyelonephritis). What are the causes? This infection may be caused by fungi, viruses, or bacteria. Bacteria are the most common cause of UTIs. This condition can also be caused by repeated incomplete emptying of the bladder during urination. What increases the risk? This condition is more likely to develop if:  You ignore your need to urinate or hold urine for long periods of time.  You do not empty your bladder completely during urination.  You wipe back to front after urinating or having a bowel movement, if you are female.  You are uncircumcised, if you are female.  You are constipated.  You have a urinary catheter that stays in place (indwelling).  You have a weak defense (immune) system.  You have a medical condition that affects your bowels, kidneys, or bladder.  You have diabetes.  You take antibiotic medicines frequently or for long periods of time, and the antibiotics no longer work well against certain types of infections (antibiotic resistance).  You take medicines that irritate your urinary tract.  You are exposed to chemicals that irritate your urinary tract.  You are female.  What are the signs or symptoms? Symptoms of this condition include:  Fever.  Frequent urination or passing small amounts of urine frequently.  Needing to urinate  urgently.  Pain or burning with urination.  Urine that smells bad or unusual.  Cloudy urine.  Pain in the lower abdomen or back.  Trouble urinating.  Blood in the urine.  Vomiting or being less hungry than normal.  Diarrhea or abdominal pain.  Vaginal discharge, if you are female.  How is this diagnosed? This condition is diagnosed with a medical history and physical exam. You will also need to provide a urine sample to test your urine. Other tests may be done, including:  Blood tests.  Sexually transmitted disease (STD) testing.  If you have had more than one UTI, a cystoscopy or imaging studies may be done to determine the cause of the infections. How is this treated? Treatment for this condition often includes a combination of two or more of the following:  Antibiotic medicine.  Other medicines to treat less common causes of UTI.  Over-the-counter medicines to treat pain.  Drinking enough water to stay hydrated.  Follow these instructions at home:  Take over-the-counter and prescription medicines only as told by your health care provider.  If you were prescribed an antibiotic, take it as told by your health care provider. Do not stop taking the antibiotic even if you start to feel better.  Avoid alcohol, caffeine, tea, and carbonated beverages. They can irritate your bladder.  Drink enough fluid to keep your urine clear or pale yellow.  Keep all follow-up visits as told by your health care provider. This is important.  Make sure to: ? Empty your bladder often and completely. Do not hold  urine for long periods of time. ? Empty your bladder before and after sex. ? Wipe from front to back after a bowel movement if you are female. Use each tissue one time when you wipe. Contact a health care provider if:  You have back pain.  You have a fever.  You feel nauseous or vomit.  Your symptoms do not get better after 3 days.  Your symptoms go away and then  return. Get help right away if:  You have severe back pain or lower abdominal pain.  You are vomiting and cannot keep down any medicines or water. This information is not intended to replace advice given to you by your health care provider. Make sure you discuss any questions you have with your health care provider. Document Released: 06/18/2005 Document Revised: 02/20/2016 Document Reviewed: 07/30/2015 Elsevier Interactive Patient Education  2017 [employer].     IF you received an x-ray today, you will receive an invoice from [facility]. Please contact [facility] at [phone] with questions or concerns regarding your invoice.   IF you received labwork today, you will receive an invoice from [place]. Please contact [facility] at [phone] with questions or concerns regarding your invoice.   Our billing staff will not be able to assist you with questions regarding bills from these companies.  You will be contacted with the lab results as soon as they are available. The fastest way to get your results is to activate your My Chart account. Instructions are located on the last page of this paperwork. If you have not heard from [place] regarding the results in 2 weeks, please contact this office.

## 2017-05-16 LAB — URINE CULTURE

## 2017-06-09 ENCOUNTER — Other Ambulatory Visit: Payer: Self-pay | Admitting: Obstetrics & Gynecology

## 2017-06-09 NOTE — Telephone Encounter (Signed)
Medication refill request: Vit D Last AEX:  04/17/16 SM Next AEX: 07/23/17 SM Last MMG (if hormonal medication request): 02/23/15 [id number]:neg  Refill authorized: 04/17/16 #[id number]/4R. Today please advise.

## 2017-06-10 MED FILL — VIT D2 1.25 MG (50,000 UNIT: 1.25 MG | 84 days supply | Qty: 12 | Fill #0

## 2017-06-16 ENCOUNTER — Ambulatory Visit (INDEPENDENT_AMBULATORY_CARE_PROVIDER_SITE_OTHER): Payer: 59 | Admitting: Family Medicine

## 2017-06-16 ENCOUNTER — Encounter: Payer: Self-pay | Admitting: Family Medicine

## 2017-06-16 VITALS — BP 126/77 | HR 66 | Temp 97.8°F | Resp 16 | Ht 67.0 in | Wt 138.0 lb

## 2017-06-16 DIAGNOSIS — N390 Urinary tract infection, site not specified: Secondary | ICD-10-CM | POA: Diagnosis not present

## 2017-06-16 DIAGNOSIS — R350 Frequency of micturition: Secondary | ICD-10-CM

## 2017-06-16 LAB — POC MICROSCOPIC URINALYSIS (UMFC): Mucus: ABSENT

## 2017-06-16 LAB — POCT URINALYSIS DIP (MANUAL ENTRY)
BILIRUBIN UA: NEGATIVE mg/dL
Bilirubin, UA: NEGATIVE
Glucose, UA: NEGATIVE mg/dL
Nitrite, UA: NEGATIVE
PH UA: 7 (ref 5.0–8.0)
PROTEIN UA: NEGATIVE mg/dL
RBC UA: NEGATIVE
SPEC GRAV UA: 1.01 (ref 1.010–1.025)
UROBILINOGEN UA: 0.2 U/dL

## 2017-06-16 MED ORDER — SULFAMETHOXAZOLE-TRIMETHOPRIM 800-160 MG PO TABS: 1.0000 | ORAL_TABLET | Freq: Two times a day (BID) | ORAL | 0 refills | Status: DC

## 2017-06-16 NOTE — Progress Notes (Signed)
Subjective:  By signing my name below, I, Essence Howell, attest that this documentation has been prepared under the direction and in the presence of Shade Flood, MD Electronically Signed: Charline Bills, ED Scribe 06/16/2017 at 5:51 PM.   Patient ID: Teresa Fitzgerald, female    DOB: [birth year], 61 y.o.   MRN: [id number]  Chief Complaint  Patient presents with  . Follow-up    seems to be back, burning, some blood, frequent urination    HPI Teresa Fitzgerald is a 61 y.o. female who presents to Primary Care at [facility] for follow-up on possible UTI. Seen 8/23 with urinary frequency x 4 days. H/o prior UTIs. Moderate LE, trace blood. Started on Septra DS x 10 days. Urine culture positive for E.coli pan-sensitive. Pt states that symptoms did not begin to improve until around 6-7 days into antibiotics. However, she thinks the UTI has returned as she is experiencing symptoms of chills, dysuria, hematuria, urinary frequency, urinary urgency, malodor onset yesterday. No treatments tried PTA. Denies fever, flank pain, abdominal pain. Pt reports seeing an urologist in the past for recurrent UTIs but states she had been doing fine for several years afterwards. She has tried Septra, Keflex, Cipro and Macrobid for previous UTIs.   Patient Active Problem List   Diagnosis Date Noted  . Seasonal allergic rhinitis due to pollen 04/11/2017  . SVT/ PSVT/ PAT 12/28/2009  . Asthma 12/27/2009  . History of cardiovascular disorder 10/29/2009  . HYPERCHOLESTEROLEMIA 10/26/2009   Past Medical History:  Diagnosis Date  . Allergic rhinitis   . Asthma    onset childhood; avoids trigger; Wert consultatoin in 2014. Dulera prescribed.  . Hypercholesteremia   . Mitral valve prolapse   . Osteopenia   . SVT (supraventricular tachycardia) (HCC)    presumed atrial tachycardia not inducible on EPS 12/11   Past Surgical History:  Procedure Laterality Date  . EP study  12/11   by JA, no inducible arrhythmia  . LUMBAR FUSION   2000  . TUBAL LIGATION  1988   Allergies  Allergen Reactions  . Oatmeal     unknown  . Other     ALL Tree nuts - swelling of mouth and throat, hives   Prior to Admission medications   Medication Sig Start Date End Date Taking? Authorizing Provider  albuterol (PROVENTIL HFA;VENTOLIN HFA) 108 (90 Base) MCG/ACT inhaler Inhale 2 puffs into the lungs every 6 (six) hours as needed. 03/27/17   Ethelda Chick, MD  aspirin 81 MG tablet Take 81 mg by mouth daily.      [provider]  Calcium Carbonate (CALCARB 600) 1500 MG TABS Take 1 tablet by mouth 2 (two) times daily.      [provider]  CARTIA XT 120 MG 24 hr capsule TAKE 1 CAPSULE BY MOUTH ONCE DAILY 09/30/16   Allred, Fayrene Fearing, MD  methocarbamol (ROBAXIN) 500 MG tablet Take 500 mg by mouth 4 (four) times daily.    [provider]  mometasone-formoterol (DULERA) 200-5 MCG/ACT AERO Inhale 2 puffs into the lungs 2 (two) times daily. inhale 2 puffs into the lungs first thing in the morning once as needed 03/27/17   Ethelda Chick, MD  pravastatin (PRAVACHOL) 40 MG tablet TAKE 1 TABLET BY MOUTH DAILY AT BEDTIME 01/02/16   Allred, Fayrene Fearing, MD  predniSONE (DELTASONE) 10 MG tablet Take 10 mg by mouth daily with breakfast.    [provider]  sulfamethoxazole-trimethoprim (BACTRIM DS,SEPTRA DS) 800-160 MG tablet Take 1  tablet by mouth 2 (two) times daily. 05/14/17   Shade Flood, MD  vitamin B-12 (CYANOCOBALAMIN) 1000 MCG tablet Take 1,000 mcg by mouth daily.    [provider]  Vitamin D, Ergocalciferol, (DRISDOL) 50000 units CAPS capsule TAKE 1 CAPSULE BY MOUTH EVERY 7 DAYS 06/10/17   Jerene Bears, MD   Social History   Social History  . Marital status: Married    Spouse name: N/A  . Number of children: 3  . Years of education: N/A   Occupational History  . RN [employer]    case manager for [employer]   Social History Main Topics  . Smoking status: Never Smoker  . Smokeless  tobacco: Never Used  . Alcohol use 8.4 oz/week    14 Standard drinks or equivalent per week     Comment: wine  . Drug use: No  . Sexual activity: Yes    Partners: Male    Birth control/ protection: Surgical, Post-menopausal     Comment: BTL   Other Topics Concern  . 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  Walking 1.5-2.0 miles per day.   Review of Systems  Constitutional: Positive for chills. Negative for fever.  Gastrointestinal: Negative for abdominal pain.  Genitourinary: Positive for dysuria, frequency, hematuria and urgency. Negative for flank pain.     Objective:   Physical Exam  Constitutional: She is oriented to person, place, and time. She appears well-developed and well-nourished. No distress.  HENT:  Head: Normocephalic and atraumatic.  Eyes: Conjunctivae and EOM are normal.  Neck: Neck supple. No tracheal deviation present.  Cardiovascular: Normal rate.   Pulmonary/Chest: Effort normal. No respiratory distress.  Abdominal: Soft. There is no tenderness. There is no CVA tenderness.  Musculoskeletal: Normal range of motion.  Neurological: She is alert and oriented to person, place, and time.  Skin: Skin is warm and dry.  Psychiatric: She has a normal mood and affect. Her behavior is normal.  Nursing note and vitals reviewed.  Vitals:   06/16/17 1705  BP: 126/77  Pulse: 66  Resp: 16  Temp: 97.8 F (36.6 C)  SpO2: 97%  Weight: 138 lb (62.6 kg)  Height:  (1.702 m)   Results for orders placed or performed in visit on 06/16/17  POCT urinalysis dipstick  Result Value Ref Range   Color, UA yellow yellow   Clarity, UA clear clear    Glucose, UA negative negative mg/dL   Bilirubin, UA negative negative   Ketones, POC UA negative negative mg/dL   Spec Grav, UA 5.784 6.962 - 1.025   Blood, UA negative negative   pH, UA 7.0 5.0 - 8.0   Protein Ur, POC negative negative mg/dL   Urobilinogen, UA 0.2 0.2 or 1.0 E.U./dL   Nitrite, UA Negative Negative   Leukocytes, UA Small (1+) (A) Negative  POCT Microscopic Urinalysis ([facility])  Result Value Ref Range   WBC,UR,HPF,POC Few (A) None WBC/hpf   RBC,UR,HPF,POC None None RBC/hpf   Bacteria Few (A) None, Too numerous to count  Mucus Absent Absent   Epithelial Cells, UR Per Microscopy Few (A) None, Too numerous to count cells/hpf      Assessment & Plan:    Teresa Fitzgerald is a 61 y.o. female Frequent urination - Plan: POCT urinalysis dipstick  Recurrent UTI - Plan: POCT Microscopic Urinalysis ([facility]), Urine Culture  Recent UTI with Escherichia coli, pansensitive. Appears to be recurrence, early UTI.  -Options discussed for antibiotics -  we'll start with Septra DS twice a day for 10 days, and check culture again. If not improving with this course, or further recurrence, would recommend urology evaluation. rtc precautions.   Meds ordered this encounter  Medications  . sulfamethoxazole-trimethoprim (BACTRIM DS,SEPTRA DS) 800-160 MG tablet    Sig: Take 1 tablet by mouth 2 (two) times daily.    Dispense:  20 tablet    Refill:  0   Patient Instructions    Based on symptom free interval, I think we can try Septra one more time for a 10 day course. If you are not improving within that time period, or any return of urinary tract infection symptoms, it may be worth an evaluation with urology. Let me know and I will be happy to place a referral. Return to the clinic or go to the nearest emergency room if any of your symptoms worsen or new symptoms occur.  Urinary Tract Infection, Adult A urinary tract infection (UTI) is an infection of any part of the urinary tract, which includes  the kidneys, ureters, bladder, and urethra. These organs make, store, and get rid of urine in the body. UTI can be a bladder infection (cystitis) or kidney infection (pyelonephritis). What are the causes? This infection may be caused by fungi, viruses, or bacteria. Bacteria are the most common cause of UTIs. This condition can also be caused by repeated incomplete emptying of the bladder during urination. What increases the risk? This condition is more likely to develop if:  You ignore your need to urinate or hold urine for long periods of time.  You do not empty your bladder completely during urination.  You wipe back to front after urinating or having a bowel movement, if you are female.  You are uncircumcised, if you are female.  You are constipated.  You have a urinary catheter that stays in place (indwelling).  You have a weak defense (immune) system.  You have a medical condition that affects your bowels, kidneys, or bladder.  You have diabetes.  You take antibiotic medicines frequently or for long periods of time, and the antibiotics no longer work well against certain types of infections (antibiotic resistance).  You take medicines that irritate your urinary tract.  You are exposed to chemicals that irritate your urinary tract.  You are female.  What are the signs or symptoms? Symptoms of this condition include:  Fever.  Frequent urination or passing small amounts of urine frequently.  Needing to urinate urgently.  Pain or burning with urination.  Urine that smells bad or unusual.  Cloudy urine.  Pain in the lower abdomen or back.  Trouble urinating.  Blood in the urine.  Vomiting or being less hungry than normal.  Diarrhea or abdominal pain.  Vaginal discharge, if you are female.  How is this diagnosed? This condition is diagnosed with a medical history and physical exam. You will also need to provide a urine sample to test your urine. Other tests  may be done, including:  Blood tests.  Sexually transmitted disease (STD) testing.  If you have had more than one UTI, a cystoscopy or imaging studies may be done to determine the cause of the infections. How is this treated? Treatment for this condition often includes a combination of two or more of the following:  Antibiotic medicine.  Other medicines to treat less common causes of UTI.  Over-the-counter medicines to treat pain.  Drinking enough water to stay hydrated.  Follow these instructions at home:  Take over-the-counter and prescription medicines only as told by your health care provider.  If you were prescribed an antibiotic, take it as told by your health care provider. Do not stop taking the antibiotic even if you start to feel better.  Avoid alcohol, caffeine, tea, and carbonated beverages. They can irritate your bladder.  Drink enough fluid to keep your urine clear or pale yellow.  Keep all follow-up visits as told by your health care provider. This is important.  Make sure to: ? Empty your bladder often and completely. Do not hold urine for long periods of time. ? Empty your bladder before and after sex. ? Wipe from front to back after a bowel movement if you are female. Use each tissue one time when you wipe. Contact a health care provider if:  You have back pain.  You have a fever.  You feel nauseous or vomit.  Your symptoms do not get better after 3 days.  Your symptoms go away and then return. Get help right away if:  You have severe back pain or lower abdominal pain.  You are vomiting and cannot keep down any medicines or water. This information is not intended to replace advice given to you by your health care provider. Make sure you discuss any questions you have with your health care provider. Document Released: 06/18/2005 Document Revised: 02/20/2016 Document Reviewed: 07/30/2015 Elsevier Interactive Patient Education  2017 [employer].    IF you received an x-ray today, you will receive an invoice from [facility]. Please contact [facility] at [phone] with questions or concerns regarding your invoice.   IF you received labwork today, you will receive an invoice from [facility]. Please contact [facility] at [phone] with questions or concerns regarding your invoice.   Our billing staff will not be able to assist you with questions regarding bills from these companies.  You will be contacted with the lab results as soon as they are available. The fastest way to get your results is to activate your My Chart account. Instructions are located on the last page of this paperwork. If you have not heard from [place] regarding the results in 2 weeks, please contact this office.      I personally performed the services described in this documentation, which was scribed in my presence. The recorded information has been reviewed and considered for accuracy and completeness, addended by me as needed, and agree with information above.  Signed,   Meredith Staggers, MD Primary Care at [facility].  06/17/17 1:17 PM

## 2017-06-16 NOTE — Patient Instructions (Addendum)
Based on symptom free interval, I think we can try Septra one more time for a 10 day course. If you are not improving within that time period, or any return of urinary tract infection symptoms, it may be worth an evaluation with urology. Let me know and I will be happy to place a referral. Return to the clinic or go to the nearest emergency room if any of your symptoms worsen or new symptoms occur.  Urinary Tract Infection, Adult A urinary tract infection (UTI) is an infection of any part of the urinary tract, which includes the kidneys, ureters, bladder, and urethra. These organs make, store, and get rid of urine in the body. UTI can be a bladder infection (cystitis) or kidney infection (pyelonephritis). What are the causes? This infection may be caused by fungi, viruses, or bacteria. Bacteria are the most common cause of UTIs. This condition can also be caused by repeated incomplete emptying of the bladder during urination. What increases the risk? This condition is more likely to develop if:  You ignore your need to urinate or hold urine for long periods of time.  You do not empty your bladder completely during urination.  You wipe back to front after urinating or having a bowel movement, if you are female.  You are uncircumcised, if you are female.  You are constipated.  You have a urinary catheter that stays in place (indwelling).  You have a weak defense (immune) system.  You have a medical condition that affects your bowels, kidneys, or bladder.  You have diabetes.  You take antibiotic medicines frequently or for long periods of time, and the antibiotics no longer work well against certain types of infections (antibiotic resistance).  You take medicines that irritate your urinary tract.  You are exposed to chemicals that irritate your urinary tract.  You are female.  What are the signs or symptoms? Symptoms of this condition include:  Fever.  Frequent urination or  passing small amounts of urine frequently.  Needing to urinate urgently.  Pain or burning with urination.  Urine that smells bad or unusual.  Cloudy urine.  Pain in the lower abdomen or back.  Trouble urinating.  Blood in the urine.  Vomiting or being less hungry than normal.  Diarrhea or abdominal pain.  Vaginal discharge, if you are female.  How is this diagnosed? This condition is diagnosed with a medical history and physical exam. You will also need to provide a urine sample to test your urine. Other tests may be done, including:  Blood tests.  Sexually transmitted disease (STD) testing.  If you have had more than one UTI, a cystoscopy or imaging studies may be done to determine the cause of the infections. How is this treated? Treatment for this condition often includes a combination of two or more of the following:  Antibiotic medicine.  Other medicines to treat less common causes of UTI.  Over-the-counter medicines to treat pain.  Drinking enough water to stay hydrated.  Follow these instructions at home:  Take over-the-counter and prescription medicines only as told by your health care provider.  If you were prescribed an antibiotic, take it as told by your health care provider. Do not stop taking the antibiotic even if you start to feel better.  Avoid alcohol, caffeine, tea, and carbonated beverages. They can irritate your bladder.  Drink enough fluid to keep your urine clear or pale yellow.  Keep all follow-up visits as told by your health care provider. This is  important.  Make sure to: ? Empty your bladder often and completely. Do not hold urine for long periods of time. ? Empty your bladder before and after sex. ? Wipe from front to back after a bowel movement if you are female. Use each tissue one time when you wipe. Contact a health care provider if:  You have back pain.  You have a fever.  You feel nauseous or vomit.  Your symptoms do  not get better after 3 days.  Your symptoms go away and then return. Get help right away if:  You have severe back pain or lower abdominal pain.  You are vomiting and cannot keep down any medicines or water. This information is not intended to replace advice given to you by your health care provider. Make sure you discuss any questions you have with your health care provider. Document Released: 06/18/2005 Document Revised: 02/20/2016 Document Reviewed: 07/30/2015 Elsevier Interactive Patient Education  2017 [employer].    IF you received an x-ray today, you will receive an invoice from [facility]. Please contact [facility] at [phone] with questions or concerns regarding your invoice.   IF you received labwork today, you will receive an invoice from [facility]. Please contact [facility] at [phone] with questions or concerns regarding your invoice.   Our billing staff will not be able to assist you with questions regarding bills from these companies.  You will be contacted with the lab results as soon as they are available. The fastest way to get your results is to activate your My Chart account. Instructions are located on the last page of this paperwork. If you have not heard from [place] regarding the results in 2 weeks, please contact this office.

## 2017-06-17 LAB — URINE CULTURE: ORGANISM ID, BACTERIA: NO GROWTH

## 2017-06-25 ENCOUNTER — Other Ambulatory Visit: Payer: Self-pay | Admitting: Obstetrics & Gynecology

## 2017-06-25 DIAGNOSIS — Z1231 Encounter for screening mammogram for malignant neoplasm of breast: Secondary | ICD-10-CM

## 2017-07-01 MED FILL — CARTIA XT 120 MG CAPSULE SA: 120 | 90 days supply | Qty: 90 | Fill #3

## 2017-07-07 ENCOUNTER — Ambulatory Visit
Admission: RE | Admit: 2017-07-07 | Discharge: 2017-07-07 | Disposition: A | Discharge status: Home / Self Care | Payer: 59 | Source: Ambulatory Visit | Attending: Obstetrics & Gynecology | Admitting: Obstetrics & Gynecology

## 2017-07-07 DIAGNOSIS — Z1231 Encounter for screening mammogram for malignant neoplasm of breast: Secondary | ICD-10-CM

## 2017-07-23 ENCOUNTER — Ambulatory Visit (INDEPENDENT_AMBULATORY_CARE_PROVIDER_SITE_OTHER): Payer: 59 | Admitting: Obstetrics & Gynecology

## 2017-07-23 ENCOUNTER — Encounter: Payer: Self-pay | Admitting: Obstetrics & Gynecology

## 2017-07-23 VITALS — BP 112/64 | HR 56 | Resp 14 | Ht 66.5 in | Wt 139.0 lb

## 2017-07-23 DIAGNOSIS — Z01419 Encounter for gynecological examination (general) (routine) without abnormal findings: Secondary | ICD-10-CM | POA: Diagnosis not present

## 2017-07-23 DIAGNOSIS — Z23 Encounter for immunization: Secondary | ICD-10-CM | POA: Diagnosis not present

## 2017-07-23 MED ORDER — SULFAMETHOXAZOLE-TRIMETHOPRIM 800-160 MG PO TABS: 1.0000 | ORAL_TABLET | Freq: Two times a day (BID) | ORAL | 0 refills | Status: DC

## 2017-07-23 MED ORDER — VITAMIN D (ERGOCALCIFEROL) 1.25 MG (50000 UNIT) PO CAPS: ORAL_CAPSULE | ORAL | 4 refills | Status: DC

## 2017-07-23 NOTE — Progress Notes (Signed)
Spoke with Koleen NimrodAdrian at [facility]. Will cancel Bactrim prescription per Dr. Hyacinth MeekerMiller.

## 2017-07-23 NOTE — Progress Notes (Signed)
61 y.o. G3P3 MarriedCaucasianF here for annual exam.  Doing well except having increased UTIs this year.  Using topical lubricants and treatment for dryness.     Has new PCP.  Will plan blood work next year.     Patient's last menstrual period was 09/22/2008 (approximate).          Sexually active: Yes.    The current method of family planning is post menopausal status.    Exercising: Yes.    walking, yoga  Smoker:  no  Health Maintenance: Pap:  04/17/16 negative, HR HPV negative, 01/06/14 negative  History of abnormal Pap:  yes MMG:  07/07/17 BIRADS 1 negative  Colonoscopy:  04/23/16 normal, repeat 10 years  BMD:   02/23/15 osteopenia  TDaP:  07/24/07- would like to update today  Pneumonia vaccine(s):  never Zostavax:   never Hep C testing: donates blood  Screening Labs: PCP, discuss with provider    reports that she has never smoked. She has never used smokeless tobacco. She reports that she drinks about 8.4 oz of alcohol per week . She reports that she does not use drugs.  Past Medical History:  Diagnosis Date  . Allergic rhinitis   . Asthma    onset childhood; avoids trigger; Wert consultatoin in 2014. Dulera prescribed.  . Hypercholesteremia   . Mitral valve prolapse   . Osteopenia   . SVT (supraventricular tachycardia) (HCC)    presumed atrial tachycardia not inducible on EPS 12/11    Past Surgical History:  Procedure Laterality Date  . EP study  12/11   by JA, no inducible arrhythmia  . LUMBAR FUSION  2000  . TUBAL LIGATION  1988    Current Outpatient Prescriptions  Medication Sig Dispense Refill  . albuterol (PROVENTIL HFA;VENTOLIN HFA) 108 (90 Base) MCG/ACT inhaler Inhale 2 puffs into the lungs every 6 (six) hours as needed. 1 Inhaler 1  . aspirin 81 MG tablet Take 81 mg by mouth daily.      . Calcium Carbonate (CALCARB 600) 1500 MG TABS Take 1 tablet by mouth 2 (two) times daily.      Marland Kitchen CARTIA XT 120 MG 24 hr capsule TAKE 1 CAPSULE BY MOUTH ONCE DAILY 90 capsule 3   . mometasone-formoterol (DULERA) 200-5 MCG/ACT AERO Inhale 2 puffs into the lungs 2 (two) times daily. inhale 2 puffs into the lungs first thing in the morning once as needed 13 g 5  . vitamin B-12 (CYANOCOBALAMIN) 1000 MCG tablet Take 1,000 mcg by mouth daily.    . Vitamin D, Ergocalciferol, (DRISDOL) 50000 units CAPS capsule TAKE 1 CAPSULE BY MOUTH EVERY 7 DAYS 12 capsule 4   No current facility-administered medications for this visit.     Family History  Problem Relation Age of Onset  . Asthma Father   . Allergies Father   . Asthma Paternal Grandfather   . Lung cancer Mother        smoked  . Hypertension Mother   . Allergies Daughter   . Colon cancer Neg Hx   . Breast cancer Neg Hx     ROS:  Pertinent items are noted in HPI.  Otherwise, a comprehensive ROS was negative.  Exam:   BP 112/64 (BP Location: Right Arm, Patient Position: Sitting, Cuff Size: Normal)   Pulse (!) 56   Resp 14   Ht 5' 6.5" (1.689 m)   Wt 139 lb (63 kg)   LMP 09/22/2008 (Approximate)   BMI 22.10 kg/m     Height:  5' 6.5" (168.9 cm)  Ht Readings from Last 3 Encounters:  07/23/17 5' 6.5" (1.689 m)  06/16/17 5\' 7"  (1.702 m)  05/14/17 5\' 7"  (1.702 m)    General appearance: alert, cooperative and appears stated age Head: Normocephalic, without obvious abnormality, atraumatic Neck: no adenopathy, supple, symmetrical, trachea midline and thyroid normal to inspection and palpation Lungs: clear to auscultation bilaterally Breasts: normal appearance, no masses or tenderness Heart: regular rate and rhythm Abdomen: soft, non-tender; bowel sounds normal; no masses,  no organomegaly Extremities: extremities normal, atraumatic, no cyanosis or edema Skin: Skin color, texture, turgor normal. No rashes or lesions Lymph nodes: Cervical, supraclavicular, and axillary nodes normal. No abnormal inguinal nodes palpated Neurologic: Grossly normal   Pelvic: External genitalia:  no lesions              Urethra:   normal appearing urethra with no masses, tenderness or lesions              Bartholins and Skenes: normal                 Vagina: normal appearing vagina with normal color and discharge, no lesions              Cervix: no lesions              Pap taken: No. Bimanual Exam:  Uterus:  normal size, contour, position, consistency, mobility, non-tender              Adnexa: normal adnexa and no mass, fullness, tenderness               Rectovaginal: Confirms               Anus:  normal sphincter tone, no lesions  Chaperone was present for exam.  A:  Well Woman with normal exam Elevated lipids H/O SVT, h/o cardiac ablation that was not successful Low Vit D Recurrent UTIs  P:   Mammogram guidelines reviewed.  Doing 3D. pap smear not obtained today.  Pap and HR HPV neg 2017.  Vit D 50K every week.  Will have this checked with Dr. Katrinka BlazingSmith. Rx for Bactrim DS bid x 3 days to keep on hand  Tdap will be updated today. return annually or prn

## 2017-07-29 MED FILL — SULFAMETHOXAZOLE/TMP DS TAB: 800-160 | 3 days supply | Qty: 6 | Fill #0

## 2017-10-02 ENCOUNTER — Other Ambulatory Visit: Payer: Self-pay | Admitting: Internal Medicine

## 2017-10-02 MED FILL — VIT D2 1.25 MG (50,000 UNIT: 1.25 MG | 84 days supply | Qty: 12 | Fill #1

## 2017-10-05 ENCOUNTER — Other Ambulatory Visit: Payer: Self-pay | Admitting: Internal Medicine

## 2017-10-06 MED FILL — CARTIA XT 120 MG CAPSULE SA: 120 | 30 days supply | Qty: 30 | Fill #0

## 2017-10-24 ENCOUNTER — Encounter: Payer: Self-pay | Admitting: Family Medicine

## 2017-10-24 ENCOUNTER — Ambulatory Visit (INDEPENDENT_AMBULATORY_CARE_PROVIDER_SITE_OTHER): Payer: 59 | Admitting: Family Medicine

## 2017-10-24 ENCOUNTER — Other Ambulatory Visit: Payer: Self-pay

## 2017-10-24 VITALS — BP 112/66 | HR 81 | Temp 97.5°F | Resp 20 | Ht 66.75 in | Wt 136.8 lb

## 2017-10-24 DIAGNOSIS — N3090 Cystitis, unspecified without hematuria: Secondary | ICD-10-CM | POA: Diagnosis not present

## 2017-10-24 DIAGNOSIS — R350 Frequency of micturition: Secondary | ICD-10-CM | POA: Diagnosis not present

## 2017-10-24 LAB — POCT URINALYSIS DIP (MANUAL ENTRY)
Bilirubin, UA: NEGATIVE
Glucose, UA: NEGATIVE mg/dL
Nitrite, UA: POSITIVE — AB
PH UA: 7 (ref 5.0–8.0)
SPEC GRAV UA: 1.02 (ref 1.010–1.025)
UROBILINOGEN UA: 1 U/dL

## 2017-10-24 LAB — POC MICROSCOPIC URINALYSIS (UMFC): Mucus: ABSENT

## 2017-10-24 MED ORDER — SULFAMETHOXAZOLE-TRIMETHOPRIM 800-160 MG PO TABS: 1.0000 | ORAL_TABLET | Freq: Two times a day (BID) | ORAL | 0 refills | Status: DC

## 2017-10-24 NOTE — Progress Notes (Signed)
Patient ID: Teresa Fitzgerald, female    DOB: 01/30/1956  Age: 62 y.o. MRN: [id number]  Chief Complaint  Patient presents with  . Urinary Frequency    x3 days, constant "spasm" pain. Hx of bladder infections. Took pyridium last night at 11pm.    Subjective:   62-year-old lady who is here with urinary frequency and dysuria for the last 3 days.  She was trying to hide it through the weekend, but is gotten pretty miserable she went ahead and came on in.  She took Pyridium last night for symptom relief.  She has had many UTIs in the past and knows the whole system.  She has been to a urologist in the past and no definitive cure was found.  Was last treated back in the fall.  Actually she had a UTI and then the next time the urinalysis was clear even though she was having symptoms.  She has not had any fever or chills or systemic illness from this infection, and is not had any history of pyelonephritis in the past.  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  Current allergies, medications, problem list, past/family and social histories reviewed.  Objective:  BP 112/66   Pulse 81   Temp (!) 97.5 F (36.4 C)   Resp 20   Ht 5' 6.75" (1.695 m)   Wt 136 lb 12.8 oz (62.1 kg)   LMP 09/22/2008 (Approximate)   SpO2 96%   BMI 21.59 kg/m   No acute distress.  No CVA tenderness.  Abdomen soft without mass or tenderness.  Assessment & Plan:   Assessment: 1. Cystitis   2. Frequency of urination       Plan: We will culture the urine.  See instructions.  Orders Placed This Encounter  Procedures  . POCT urinalysis dipstick  . POCT Microscopic Urinalysis (UMFC)    No orders of the defined types were placed in this encounter.        Patient Instructions  Drink plenty of fluids as you already are attempting.  If you develop systemic symptoms such as fever, chills, flank pain, etc. you should return for a recheck here or with your PCP or urologist or go to the ER if  necessary.  Take the Bactrim (sulfamethoxazole) 1 pill twice daily with food      No Follow-up on file.   Daysia Vandenboom, MD 10/24/2017

## 2017-10-24 NOTE — Patient Instructions (Addendum)
Drink plenty of fluids as you already are attempting.  If you develop systemic symptoms such as fever, chills, flank pain, etc. you should return for a recheck here or with your PCP or urologist or go to the ER if necessary.  Take the Bactrim (sulfamethoxazole) 1 pill twice daily with food   Urinary Tract Infection, Adult A urinary tract infection (UTI) is an infection of any part of the urinary tract. The urinary tract includes the:  Kidneys.  Ureters.  Bladder.  Urethra.  These organs make, store, and get rid of pee (urine) in the body. Follow these instructions at home:  Take over-the-counter and prescription medicines only as told by your doctor.  If you were prescribed an antibiotic medicine, take it as told by your doctor. Do not stop taking the antibiotic even if you start to feel better.  Avoid the following drinks: ? Alcohol. ? Caffeine. ? Tea. ? Carbonated drinks.  Drink enough fluid to keep your pee clear or pale yellow.  Keep all follow-up visits as told by your doctor. This is important.  Make sure to: ? Empty your bladder often and completely. Do not to hold pee for long periods of time. ? Empty your bladder before and after sex. ? Wipe from front to back after a bowel movement if you are female. Use each tissue one time when you wipe. Contact a doctor if:  You have back pain.  You have a fever.  You feel sick to your stomach (nauseous).  You throw up (vomit).  Your symptoms do not get better after 3 days.  Your symptoms go away and then come back. Get help right away if:  You have very bad back pain.  You have very bad lower belly (abdominal) pain.  You are throwing up and cannot keep down any medicines or water. This information is not intended to replace advice given to you by your health care provider. Make sure you discuss any questions you have with your health care provider. Document Released: 02/25/2008 Document Revised: 02/14/2016  Document Reviewed: 07/30/2015 Elsevier Interactive Patient Education  [employer]2018 Elsevier Inc.

## 2017-10-27 LAB — URINE CULTURE

## 2017-10-28 NOTE — Progress Notes (Signed)
Pt notifed.

## 2017-11-16 ENCOUNTER — Ambulatory Visit (INDEPENDENT_AMBULATORY_CARE_PROVIDER_SITE_OTHER): Payer: 59 | Admitting: Family Medicine

## 2017-11-16 ENCOUNTER — Encounter: Payer: Self-pay | Admitting: Family Medicine

## 2017-11-16 ENCOUNTER — Other Ambulatory Visit: Payer: Self-pay

## 2017-11-16 VITALS — BP 118/72 | HR 69 | Temp 98.0°F | Resp 18 | Ht 66.93 in | Wt 136.8 lb

## 2017-11-16 DIAGNOSIS — E7800 Pure hypercholesterolemia, unspecified: Secondary | ICD-10-CM | POA: Diagnosis not present

## 2017-11-16 DIAGNOSIS — I471 Supraventricular tachycardia: Secondary | ICD-10-CM

## 2017-11-16 DIAGNOSIS — E559 Vitamin D deficiency, unspecified: Secondary | ICD-10-CM | POA: Diagnosis not present

## 2017-11-16 DIAGNOSIS — J301 Allergic rhinitis due to pollen: Secondary | ICD-10-CM | POA: Diagnosis not present

## 2017-11-16 DIAGNOSIS — J4540 Moderate persistent asthma, uncomplicated: Secondary | ICD-10-CM | POA: Diagnosis not present

## 2017-11-16 DIAGNOSIS — Z0000 Encounter for general adult medical examination without abnormal findings: Secondary | ICD-10-CM | POA: Diagnosis not present

## 2017-11-16 DIAGNOSIS — Z131 Encounter for screening for diabetes mellitus: Secondary | ICD-10-CM | POA: Diagnosis not present

## 2017-11-16 MED ORDER — DILTIAZEM HCL ER COATED BEADS 120 MG PO CP24: 120.0000 mg | ORAL_CAPSULE | Freq: Every day | ORAL | 3 refills | Status: DC

## 2017-11-16 MED FILL — CARTIA XT 120 MG CAPSULE SA: 120 | 90 days supply | Qty: 90 | Fill #0

## 2017-11-16 NOTE — Progress Notes (Signed)
Subjective:    Patient ID: Teresa Fitzgerald, female    DOB: 1955-11-27, 62 y.o.   MRN: [id number]  11/16/2017  Annual Exam    HPI This 62 y.o. female presents for Routine Physical Examination.  Last physical:  2016 Pap smear:  03/2016 [facility] gynecology Mammogram:   07-07-2017 Colonoscopy:  04-23-2016 Bone density:  2016; retiring at end of the year.    Hypercholesterolemia: stopped statin in October 2017.  No side effects; does not like taking it.   SVT: Patient reports good compliance with medication, good tolerance to medication, and good symptom control. Daily SVT; performed mapping yet unable to undergo ablation.  Monitor and intermittent throughout the day.  Every day.   Duration of one minute x 3-4 times.  Asthma: never needs Albuterol; Dulera as needed.  Dr. Sherene Sires prescribed daily during cold weather.  Cats are trigger.  Recent UTI: s/p Bactrim.  Usually two per year.  Uses olive oil vaginal topical. S/p urology consultation with urodynamics.  Scheduled restroom breaks.  Shingles twice.  Not interested in vaccination at this time.  BP Readings from Last 3 Encounters:  11/16/17 118/72  10/24/17 112/66  07/23/17 112/64   Wt Readings from Last 3 Encounters:  11/16/17 136 lb 12.8 oz (62.1 kg)  10/24/17 136 lb 12.8 oz (62.1 kg)  07/23/17 139 lb (63 kg)   Immunization History  Administered Date(s) Administered  . Influenza-Unspecified 06/15/2017  . Tdap 07/24/2007, 07/23/2017    Review of Systems  Constitutional: Negative for activity change, appetite change, chills, diaphoresis, fatigue, fever and unexpected weight change.  HENT: Negative for congestion, dental problem, drooling, ear discharge, ear pain, facial swelling, hearing loss, mouth sores, nosebleeds, postnasal drip, rhinorrhea, sinus pressure, sneezing, sore throat, tinnitus, trouble swallowing and voice change.   Eyes: Negative for photophobia, pain, discharge, redness, itching and visual disturbance.    Respiratory: Negative for apnea, cough, choking, chest tightness, shortness of breath, wheezing and stridor.   Cardiovascular: Negative for chest pain, palpitations and leg swelling.  Gastrointestinal: Negative for abdominal distention, abdominal pain, anal bleeding, blood in stool, constipation, diarrhea, nausea, rectal pain and vomiting.  Endocrine: Negative for cold intolerance, heat intolerance, polydipsia, polyphagia and polyuria.  Genitourinary: Negative for decreased urine volume, difficulty urinating, dyspareunia, dysuria, enuresis, flank pain, frequency, genital sores, hematuria, menstrual problem, pelvic pain, urgency, vaginal bleeding, vaginal discharge and vaginal pain.  Musculoskeletal: Negative for arthralgias, back pain, gait problem, joint swelling, myalgias, neck pain and neck stiffness.  Skin: Negative for color change, pallor, rash and wound.  Allergic/Immunologic: Negative for environmental allergies, food allergies and immunocompromised state.  Neurological: Negative for dizziness, tremors, seizures, syncope, facial asymmetry, speech difficulty, weakness, light-headedness, numbness and headaches.  Hematological: Negative for adenopathy. Does not bruise/bleed easily.  Psychiatric/Behavioral: Negative for agitation, behavioral problems, confusion, decreased concentration, dysphoric mood, hallucinations, self-injury, sleep disturbance and suicidal ideas. The patient is not nervous/anxious and is not hyperactive.     Past Medical History:  Diagnosis Date  . Allergic rhinitis   . Asthma    onset childhood; avoids trigger; Wert consultatoin in 2014. Dulera prescribed.  . Hypercholesteremia   . Mitral valve prolapse   . Osteopenia   . SVT (supraventricular tachycardia) (HCC)    presumed atrial tachycardia not inducible on EPS 12/11   Past Surgical History:  Procedure Laterality Date  . EP study  12/11   by JA, no inducible arrhythmia  . LUMBAR FUSION  2000  . TUBAL  LIGATION  1988   Allergies  Allergen Reactions  . Oatmeal     unknown  . Other     ALL Tree nuts - swelling of mouth and throat, hives   Current Outpatient Medications on File Prior to Visit  Medication Sig Dispense Refill  . albuterol (PROVENTIL HFA;VENTOLIN HFA) 108 (90 Base) MCG/ACT inhaler Inhale 2 puffs into the lungs every 6 (six) hours as needed. 1 Inhaler 1  . aspirin 81 MG tablet Take 81 mg by mouth daily.      . Calcium Carbonate (CALCARB 600) 1500 MG TABS Take 1 tablet by mouth 2 (two) times daily.      . mometasone-formoterol (DULERA) 200-5 MCG/ACT AERO Inhale 2 puffs into the lungs 2 (two) times daily. inhale 2 puffs into the lungs first thing in the morning once as needed 13 g 5  . vitamin B-12 (CYANOCOBALAMIN) 1000 MCG tablet Take 1,000 mcg by mouth daily.    . Vitamin D, Ergocalciferol, (DRISDOL) 50000 units CAPS capsule TAKE 1 CAPSULE BY MOUTH EVERY 7 DAYS 12 capsule 4   No current facility-administered medications on file prior to visit.    Social History   Socioeconomic History  . Marital status: Married    Spouse name: Not on file  . Number of children: 3  . Years of education: Not on file  . Highest education level: Not on file  Social Needs  . Financial resource strain: Not on file  . Food insecurity - worry: Not on file  . Food insecurity - inability: Not on file  . Transportation needs - medical: Not on file  . Transportation needs - non-medical: Not on file  Occupational History  . Occupation: Teacher, 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[employer]    Comment: case Production designer, theatre/television/film for [employer]  Tobacco Use  . Smoking status: Never Smoker  . Smokeless tobacco: Never Used  Substance and Sexual Activity  . Alcohol use: Yes    Alcohol/week: 8.4 oz    Types: 14 Standard drinks or equivalent per week    Comment: wine  . Drug use: No  . Sexual activity: Yes    Partners: Male    Birth control/protection: Surgical, Post-menopausal    Comment: BTL  Other Topics Concern   . 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  Walking 1.5-2.0 miles per day.   Family History  Problem Relation Age of Onset  . Asthma Father   . Allergies Father   . Asthma Paternal Grandfather   . Lung cancer Mother        smoked  . Hypertension Mother   . Allergies Daughter   . Colon cancer Neg Hx   . Breast cancer Neg Hx        Objective:    BP 118/72   Pulse 69   Temp 98 F (36.7 C) (Oral)   Resp 18   Ht 5' 6.93" (1.7 m)   Wt 136 lb 12.8 oz (62.1 kg)   LMP 09/22/2008 (Approximate)   SpO2 96%   BMI 21.47 kg/m  Physical Exam  Constitutional: She  is oriented to person, place, and time. She appears well-developed and well-nourished. No distress.  HENT:  Head: Normocephalic and atraumatic.  Right Ear: Hearing, tympanic membrane, external ear and ear canal normal.  Left Ear: Hearing, tympanic membrane, external ear and ear canal normal.  Nose: Nose normal.  Mouth/Throat: Oropharynx is clear and moist.  Eyes: Conjunctivae and EOM are normal. Pupils are equal, round, and reactive to light.  Neck: Normal range of motion and full passive range of motion without pain. Neck supple. No JVD present. Carotid bruit is not present. No thyromegaly present.  Cardiovascular: Normal rate, regular rhythm, normal heart sounds and intact distal pulses. Exam reveals no gallop and no friction rub.  No murmur heard. Pulmonary/Chest: Effort normal and breath sounds normal. No respiratory distress. She has no wheezes. She has no rales. Right breast exhibits no inverted nipple, no mass, no nipple discharge, no skin change and no tenderness. Left breast exhibits no inverted  nipple, no mass, no nipple discharge, no skin change and no tenderness. Breasts are symmetrical.  Abdominal: Soft. Bowel sounds are normal. She exhibits no distension and no mass. There is no tenderness. There is no rebound and no guarding.  Musculoskeletal:       Right shoulder: Normal.       Left shoulder: Normal.       Cervical back: Normal.  Lymphadenopathy:    She has no cervical adenopathy.  Neurological: She is alert and oriented to person, place, and time. She has normal reflexes. No cranial nerve deficit. She exhibits normal muscle tone. Coordination normal.  Skin: Skin is warm and dry. No rash noted. She is not diaphoretic. No erythema. No pallor.  Psychiatric: She has a normal mood and affect. Her behavior is normal. Judgment and thought content normal.  Nursing note and vitals reviewed.  No results found. Depression screen [facility] 2/9 11/16/2017 10/24/2017 06/16/2017 05/14/2017 03/27/2017  Decreased Interest 0 0 0 0 0  Down, Depressed, Hopeless 0 0 0 0 0  PHQ - 2 Score 0 0 0 0 0   Fall Risk  11/16/2017 10/24/2017 06/16/2017 05/14/2017 03/27/2017  Falls in the past year? No No No No No        Assessment & Plan:   1. Routine physical examination   2. HYPERCHOLESTEROLEMIA   3. SVT/ PSVT/ PAT   4. Moderate persistent asthma without complication   5. Seasonal allergic rhinitis due to pollen   6. Screening for diabetes mellitus   7. Vitamin D deficiency     -anticipatory guidance provided --- exercise, weight loss, safe driving practices, aspirin 81mg  daily. -obtain age appropriate screening labs and labs for chronic disease management. -Gynecological care managed by Dr. Hyacinth Meeker. Pap smear and mammogram up-to-date.  Going to wait on bone density scan in the upcoming year due to insurance issues. -SVT well controlled with cardiac.  Refill provided.  Last EKG in 2017.  Not a candidate currently for ablation per Dr. Johney Frame. -Mild to moderate persistent asthma without complications.  Continue  Dulera and albuterol as needed.  Sparing use of both. -Hypercholesterolemia: Patient stopped statin therapy 1 year ago.  Repeat fasting lipid panel today.  No family history of early coronary artery disease.   -Vitamin D deficiency:  Controlled on weekly vitamin D prescription.  Obtain level today with treatment.    Orders Placed This Encounter  Procedures  . CBC with Differential/Platelet  . Comprehensive metabolic panel    Order Specific Question:   Has the patient fasted?  Answer:   No  . Lipid panel    Order Specific Question:   Has the patient fasted?    Answer:   No  . TSH  . Urinalysis, dipstick only  . Hemoglobin A1c  . VITAMIN D 25 Hydroxy (Vit-D Deficiency, Fractures)   Meds ordered this encounter  Medications  . diltiazem (CARTIA XT) 120 MG 24 hr capsule    Sig: Take 1 capsule (120 mg total) by mouth daily.    Dispense:  90 capsule    Refill:  3    Return in about 1 year (around 11/16/2018) for complete physical examiniation.   Edvardo Honse Paulita FujitaMartin Jayli Fogleman, M.D. Primary Care at [facility] [place]  [place] [facility] [address] [place], [place]  [id number] [phone] phone [phone]) [phone] fax

## 2017-11-16 NOTE — Patient Instructions (Addendum)
IF you received an x-ray today, you will receive an invoice from [facility]. Please contact [facility] at [phone] with questions or concerns regarding your invoice.   IF you received labwork today, you will receive an invoice from [facility]. Please contact [facility] at [phone] with questions or concerns regarding your invoice.   Our billing staff will not be able to assist you with questions regarding bills from these companies.  You will be contacted with the lab results as soon as they are available. The fastest way to get your results is to activate your My Chart account. Instructions are located on the last page of this paperwork. If you have not heard from [place] regarding the results in 2 weeks, please contact this office.      Preventive Care 40-64 Years, Female Preventive care refers to lifestyle choices and visits with your health care provider that can promote health and wellness. What does preventive care include?  A yearly physical exam. This is also called an annual well check.  Dental exams once or twice a year.  Routine eye exams. Ask your health care provider how often you should have your eyes checked.  Personal lifestyle choices, including: ? Daily care of your teeth and gums. ? Regular physical activity. ? Eating a healthy diet. ? Avoiding tobacco and drug use. ? Limiting alcohol use. ? Practicing safe sex. ? Taking low-dose aspirin daily starting at age 34. ? Taking vitamin and mineral supplements as recommended by your health care provider. What happens during an annual well check? The services and screenings done by your health care provider during your annual well check will depend on your age, overall health, lifestyle risk factors, and family history of disease. Counseling Your health care provider may ask you questions about your:  Alcohol use.  Tobacco use.  Drug use.  Emotional well-being.  Home and relationship  well-being.  Sexual activity.  Eating habits.  Work and work Statistician.  Method of birth control.  Menstrual cycle.  Pregnancy history.  Screening You may have the following tests or measurements:  Height, weight, and BMI.  Blood pressure.  Lipid and cholesterol levels. These may be checked every 5 years, or more frequently if you are over 65 years old.  Skin check.  Lung cancer screening. You may have this screening every year starting at age 102 if you have a 30-pack-year history of smoking and currently smoke or have quit within the past 15 years.  Fecal occult blood test (FOBT) of the stool. You may have this test every year starting at age 33.  Flexible sigmoidoscopy or colonoscopy. You may have a sigmoidoscopy every 5 years or a colonoscopy every 10 years starting at age 23.  Hepatitis C blood test.  Hepatitis B blood test.  Sexually transmitted disease (STD) testing.  Diabetes screening. This is done by checking your blood sugar (glucose) after you have not eaten for a while (fasting). You may have this done every 1-3 years.  Mammogram. This may be done every 1-2 years. Talk to your health care provider about when you should start having regular mammograms. This may depend on whether you have a family history of breast cancer.  BRCA-related cancer screening. This may be done if you have a family history of breast, ovarian, tubal, or peritoneal cancers.  Pelvic exam and Pap test. This may be done every 3 years starting at age 3. Starting at age 55, this may be done every 5 years if  you have a Pap test in combination with an HPV test.  Bone density scan. This is done to screen for osteoporosis. You may have this scan if you are at high risk for osteoporosis.  Discuss your test results, treatment options, and if necessary, the need for more tests with your health care provider. Vaccines Your health care provider may recommend certain vaccines, such  as:  Influenza vaccine. This is recommended every year.  Tetanus, diphtheria, and acellular pertussis (Tdap, Td) vaccine. You may need a Td booster every 10 years.  Varicella vaccine. You may need this if you have not been vaccinated.  Zoster vaccine. You may need this after age 77.  Measles, mumps, and rubella (MMR) vaccine. You may need at least one dose of MMR if you were born in 1957 or later. You may also need a second dose.  Pneumococcal 13-valent conjugate (PCV13) vaccine. You may need this if you have certain conditions and were not previously vaccinated.  Pneumococcal polysaccharide (PPSV23) vaccine. You may need one or two doses if you smoke cigarettes or if you have certain conditions.  Meningococcal vaccine. You may need this if you have certain conditions.  Hepatitis A vaccine. You may need this if you have certain conditions or if you travel or work in places where you may be exposed to hepatitis A.  Hepatitis B vaccine. You may need this if you have certain conditions or if you travel or work in places where you may be exposed to hepatitis B.  Haemophilus influenzae type b (Hib) vaccine. You may need this if you have certain conditions.  Talk to your health care provider about which screenings and vaccines you need and how often you need them. This information is not intended to replace advice given to you by your health care provider. Make sure you discuss any questions you have with your health care provider. Document Released: 10/05/2015 Document Revised: 05/28/2016 Document Reviewed: 07/10/2015 Elsevier Interactive Patient Education  [employer].

## 2017-11-16 NOTE — Progress Notes (Deleted)
Subjective:    Patient ID: Teresa Fitzgerald, female    DOB: [birth year], 62 y.o.   MRN: [id number]  11/16/2017  Chronic Conditions (6 month follow-up )    HPI This 62 y.o. female presents for evaluation of ***. BP Readings from Last 3 Encounters:  10/24/17 112/66  07/23/17 112/64  06/16/17 126/77   Wt Readings from Last 3 Encounters:  10/24/17 136 lb 12.8 oz (62.1 kg)  07/23/17 139 lb (63 kg)  06/16/17 138 lb (62.6 kg)   Immunization History  Administered Date(s) Administered  . Influenza-Unspecified 06/15/2017  . Tdap 07/24/2007, 07/23/2017    Review of Systems  Past Medical History:  Diagnosis Date  . Allergic rhinitis   . Asthma    onset childhood; avoids trigger; Wert consultatoin in 2014. Dulera prescribed.  . Hypercholesteremia   . Mitral valve prolapse   . Osteopenia   . SVT (supraventricular tachycardia) (HCC)    presumed atrial tachycardia not inducible on EPS 12/11   Past Surgical History:  Procedure Laterality Date  . EP study  12/11   by JA, no inducible arrhythmia  . LUMBAR FUSION  2000  . TUBAL LIGATION  1988   Allergies  Allergen Reactions  . Oatmeal     unknown  . Other     ALL Tree nuts - swelling of mouth and throat, hives   Current Outpatient Medications on File Prior to Visit  Medication Sig Dispense Refill  . albuterol (PROVENTIL HFA;VENTOLIN HFA) 108 (90 Base) MCG/ACT inhaler Inhale 2 puffs into the lungs every 6 (six) hours as needed. 1 Inhaler 1  . aspirin 81 MG tablet Take 81 mg by mouth daily.      . Calcium Carbonate (CALCARB 600) 1500 MG TABS Take 1 tablet by mouth 2 (two) times daily.      Marland Kitchen CARTIA XT 120 MG 24 hr capsule TAKE 1 CAPSULE BY MOUTH ONCE DAILY 30 capsule 0  . mometasone-formoterol (DULERA) 200-5 MCG/ACT AERO Inhale 2 puffs into the lungs 2 (two) times daily. inhale 2 puffs into the lungs first thing in the morning once as needed 13 g 5  . sulfamethoxazole-trimethoprim (BACTRIM DS,SEPTRA DS) 800-160 MG tablet Take 1  tablet by mouth 2 (two) times daily. 20 tablet 0  . vitamin B-12 (CYANOCOBALAMIN) 1000 MCG tablet Take 1,000 mcg by mouth daily.    . Vitamin D, Ergocalciferol, (DRISDOL) 50000 units CAPS capsule TAKE 1 CAPSULE BY MOUTH EVERY 7 DAYS 12 capsule 4   No current facility-administered medications on file prior to visit.    Social History   Socioeconomic History  . Marital status: Married    Spouse name: Not on file  . Number of children: 3  . Years of education: Not on file  . Highest education level: Not on file  Social Needs  . Financial resource strain: Not on file  . Food insecurity - worry: Not on file  . Food insecurity - inability: Not on file  . Transportation needs - medical: Not on file  . Transportation needs - non-medical: Not on file  Occupational History  . Occupation: Teacher, 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[employer]    Comment: case Production designer, theatre/television/film for [place]  Tobacco Use  . Smoking status: Never Smoker  . Smokeless tobacco: Never Used  Substance and Sexual Activity  . Alcohol use: Yes    Alcohol/week: 8.4 oz    Types: 14 Standard drinks or equivalent per week    Comment: wine  .  Drug use: No  . Sexual activity: Yes    Partners: Male    Birth control/protection: Surgical, Post-menopausal    Comment: BTL  Other Topics Concern  . 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  Walking 1.5-2.0 miles per day.   Family History  Problem Relation Age of Onset  . Asthma Father   . Allergies Father   . Asthma Paternal Grandfather   . Lung cancer Mother        smoked  . Hypertension Mother   . Allergies Daughter   . Colon cancer Neg Hx   . Breast cancer Neg  Hx        Objective:    LMP 09/22/2008 (Approximate)  Physical Exam No results found. [facility] 10/24/2017 06/16/2017 05/14/2017 03/27/2017  Decreased Interest 0 0 0 0  Down, Depressed, Hopeless 0 0 0 0  PHQ - 2 Score 0 0 0 0   Fall Risk  10/24/2017 06/16/2017 05/14/2017 03/27/2017  Falls in the past year? No No No No        Assessment & Plan:  No diagnosis found.  ***  No orders of the defined types were placed in this encounter.  No orders of the defined types were placed in this encounter.   No Follow-up on file.   Arienna Benegas Paulita FujitaMartin Raeven Pint, M.D. Primary Care at [facility]  [place] [facility] [address] [place], [place]  [address] [phone] phone [phone] fax

## 2017-11-17 LAB — COMPREHENSIVE METABOLIC PANEL
ALK PHOS: 104 IU/L (ref 39–117)
ALT: 15 IU/L (ref 0–32)
AST: 18 IU/L (ref 0–40)
Albumin/Globulin Ratio: 2.1 (ref 1.2–2.2)
Albumin: 4.7 g/dL (ref 3.6–4.8)
BUN/Creatinine Ratio: 10 — ABNORMAL LOW (ref 12–28)
BUN: 7 mg/dL — ABNORMAL LOW (ref 8–27)
Bilirubin Total: 0.5 mg/dL (ref 0.0–1.2)
CALCIUM: 9.8 mg/dL (ref 8.7–10.3)
CO2: 21 mmol/L (ref 20–29)
CREATININE: 0.68 mg/dL (ref 0.57–1.00)
Chloride: 101 mmol/L (ref 96–106)
GFR calc Af Amer: 109 mL/min/{1.73_m2} (ref >59)
GFR, EST NON AFRICAN AMERICAN: 95 mL/min/{1.73_m2} (ref >59)
GLUCOSE: 97 mg/dL (ref 65–99)
Globulin, Total: 2.2 g/dL (ref 1.5–4.5)
Potassium: 4.7 mmol/L (ref 3.5–5.2)
SODIUM: 138 mmol/L (ref 134–144)
Total Protein: 6.9 g/dL (ref 6.0–8.5)

## 2017-11-17 LAB — CBC WITH DIFFERENTIAL/PLATELET
BASOS ABS: 0 10*3/uL (ref 0.0–0.2)
Basos: 1 %
EOS (ABSOLUTE): 0.1 10*3/uL (ref 0.0–0.4)
Eos: 2 %
Hematocrit: 39.7 % (ref 34.0–46.6)
Hemoglobin: 12.9 g/dL (ref 11.1–15.9)
IMMATURE GRANS (ABS): 0 10*3/uL (ref 0.0–0.1)
IMMATURE GRANULOCYTES: 0 %
LYMPHS: 38 %
Lymphocytes Absolute: 1.7 10*3/uL (ref 0.7–3.1)
MCH: 30.6 pg (ref 26.6–33.0)
MCHC: 32.5 g/dL (ref 31.5–35.7)
MCV: 94 fL (ref 79–97)
Monocytes Absolute: 0.3 10*3/uL (ref 0.1–0.9)
Monocytes: 6 %
NEUTROS PCT: 53 %
Neutrophils Absolute: 2.5 10*3/uL (ref 1.4–7.0)
PLATELETS: 331 10*3/uL (ref 150–379)
RBC: 4.21 x10E6/uL (ref 3.77–5.28)
RDW: 13.3 % (ref 12.3–15.4)
WBC: 4.6 10*3/uL (ref 3.4–10.8)

## 2017-11-17 LAB — URINALYSIS, DIPSTICK ONLY
BILIRUBIN UA: NEGATIVE
GLUCOSE, UA: NEGATIVE
Ketones, UA: NEGATIVE
Nitrite, UA: NEGATIVE
PROTEIN UA: NEGATIVE
RBC, UA: NEGATIVE
Specific Gravity, UA: 1.011 (ref 1.005–1.030)
UUROB: 0.2 mg/dL (ref 0.2–1.0)

## 2017-11-17 LAB — HEMOGLOBIN A1C
ESTIMATED AVERAGE GLUCOSE: 97 mg/dL
HEMOGLOBIN A1C: 5 % (ref 4.8–5.6)

## 2017-11-17 LAB — LIPID PANEL
CHOL/HDL RATIO: 5.7 ratio — AB (ref 0.0–4.4)
CHOLESTEROL TOTAL: 217 mg/dL — AB (ref 100–199)
HDL: 38 mg/dL — AB (ref >39)
LDL CALC: 150 mg/dL — AB (ref 0–99)
TRIGLYCERIDES: 147 mg/dL (ref 0–149)
VLDL Cholesterol Cal: 29 mg/dL (ref 5–40)

## 2017-11-17 LAB — VITAMIN D 25 HYDROXY (VIT D DEFICIENCY, FRACTURES): Vit D, 25-Hydroxy: 34.3 ng/mL (ref 30.0–100.0)

## 2017-11-17 LAB — TSH: TSH: 1.43 u[IU]/mL (ref 0.450–4.500)

## 2017-12-30 MED FILL — VIT D2 1.25 MG (50,000 UNIT: 1.25 MG | 84 days supply | Qty: 12 | Fill #2

## 2018-02-09 ENCOUNTER — Other Ambulatory Visit: Payer: Self-pay

## 2018-02-09 ENCOUNTER — Ambulatory Visit (INDEPENDENT_AMBULATORY_CARE_PROVIDER_SITE_OTHER): Payer: 59 | Admitting: Physician Assistant

## 2018-02-09 ENCOUNTER — Encounter: Payer: Self-pay | Admitting: Physician Assistant

## 2018-02-09 VITALS — BP 130/64 | HR 78 | Temp 98.6°F | Resp 16 | Ht 66.93 in | Wt 135.0 lb

## 2018-02-09 DIAGNOSIS — N390 Urinary tract infection, site not specified: Secondary | ICD-10-CM

## 2018-02-09 DIAGNOSIS — R350 Frequency of micturition: Secondary | ICD-10-CM

## 2018-02-09 LAB — POCT URINALYSIS DIP (MANUAL ENTRY)
Bilirubin, UA: NEGATIVE
Blood, UA: NEGATIVE
GLUCOSE UA: NEGATIVE mg/dL
Ketones, POC UA: NEGATIVE mg/dL
NITRITE UA: NEGATIVE
Protein Ur, POC: NEGATIVE mg/dL
Spec Grav, UA: 1.015 (ref 1.010–1.025)
UROBILINOGEN UA: 0.2 U/dL
pH, UA: 7.5 (ref 5.0–8.0)

## 2018-02-09 MED ORDER — NITROFURANTOIN MONOHYD MACRO 100 MG PO CAPS: 100.0000 mg | ORAL_CAPSULE | Freq: Two times a day (BID) | ORAL | 0 refills | Status: DC

## 2018-02-09 NOTE — Patient Instructions (Signed)
     IF you received an x-ray today, you will receive an invoice from [facility]. Please contact [facility] at [phone] with questions or concerns regarding your invoice.   IF you received labwork today, you will receive an invoice from [facility]. Please contact [facility] at [phone] with questions or concerns regarding your invoice.   Our billing staff will not be able to assist you with questions regarding bills from these companies.  You will be contacted with the lab results as soon as they are available. The fastest way to get your results is to activate your My Chart account. Instructions are located on the last page of this paperwork. If you have not heard from us regarding the results in 2 weeks, please contact this office.     

## 2018-02-09 NOTE — Progress Notes (Signed)
Patient ID: Teresa Fitzgerald, female    DOB: 06/13/1956, 62 y.o.   MRN: [id number]  PCP: Ethelda Chick, MD  Chief Complaint  Patient presents with  . Urinary Tract Infection    urgency to void, spasms, since Friday     Subjective:   Presents for evaluation of possible UTI.  Reports burning of the distal urethra, increased urinary urgency, urethral spasms x 5 days. Developed generalized malaise last night.  E coli UTI 10/2017, sensitive to all tested agents. Treated with SMX-TMP. Previous UTI was 04/2017, also treated with SMX-TMP. Has symptoms frequently, sometimes she can resolve the symptoms with increased oral hydration. Notes that it typically takes longer courses of antibiotics to resolve her symptoms.  Double voids, stays well hydrated. Does not hold urine. No diarrhea, constipation. Married, but not sexually active. No atypical vaginal discharge. Doesn't want to use estrogen, prophylactic antibiotics.    Review of Systems As above.    Patient Active Problem List   Diagnosis Date Noted  . Seasonal allergic rhinitis due to pollen 04/11/2017  . Other nonrheumatic mitral valve disorders 01/08/2016  . SVT/ PSVT/ PAT 12/28/2009  . Asthma 12/27/2009  . History of cardiovascular disorder 10/29/2009  . HYPERCHOLESTEROLEMIA 10/26/2009     Prior to Admission medications   Medication Sig Start Date End Date Taking? Authorizing Provider  albuterol (PROVENTIL HFA;VENTOLIN HFA) 108 (90 Base) MCG/ACT inhaler Inhale 2 puffs into the lungs every 6 (six) hours as needed. 03/27/17  Yes Ethelda Chick, MD  aspirin 81 MG tablet Take 81 mg by mouth daily.     Yes [provider]  Calcium Carbonate (CALCARB 600) 1500 MG TABS Take 1 tablet by mouth 2 (two) times daily.     Yes [provider]  diltiazem (CARTIA XT) 120 MG 24 hr capsule Take 1 capsule (120 mg total) by mouth daily. 11/16/17  Yes Ethelda Chick, MD  mometasone-formoterol [facility]) 200-5 MCG/ACT AERO  Inhale 2 puffs into the lungs 2 (two) times daily. inhale 2 puffs into the lungs first thing in the morning once as needed 03/27/17  Yes Ethelda Chick, MD  vitamin B-12 (CYANOCOBALAMIN) 1000 MCG tablet Take 1,000 mcg by mouth daily.   Yes [provider]  Vitamin D, Ergocalciferol, (DRISDOL) 50000 units CAPS capsule TAKE 1 CAPSULE BY MOUTH EVERY 7 DAYS 07/23/17  Yes Jerene Bears, MD     Allergies  Allergen Reactions  . Oatmeal     unknown  . Other     ALL Tree nuts - swelling of mouth and throat, hives       Objective:  Physical Exam  Constitutional: She is oriented to person, place, and time. Vital signs are normal. She appears well-developed and well-nourished. No distress.  HENT:  Head: Normocephalic and atraumatic.  Cardiovascular: Normal rate, regular rhythm and normal heart sounds.  Pulmonary/Chest: Effort normal and breath sounds normal.  Abdominal: Soft. Normal appearance and bowel sounds are normal. She exhibits no distension and no mass. There is no hepatosplenomegaly. There is tenderness in the suprapubic area. There is no rigidity, no rebound, no guarding, no CVA tenderness, no tenderness at McBurney's point and negative Murphy's sign. No hernia.  Musculoskeletal: Normal range of motion.       Lumbar back: Normal.  Neurological: She is alert and oriented to person, place, and time.  Skin: Skin is warm and dry. No rash noted. She is not diaphoretic. No pallor.  Psychiatric: She has a normal  mood and affect. Her speech is normal and behavior is normal. Judgment normal.    Results for orders placed or performed in visit on 02/09/18  POCT urinalysis dipstick  Result Value Ref Range   Color, UA yellow yellow   Clarity, UA clear clear   Glucose, UA negative negative mg/dL   Bilirubin, UA negative negative   Ketones, POC UA negative negative mg/dL   Spec Grav, UA 1.610 9.604 - 1.025   Blood, UA negative negative   pH, UA 7.5 5.0 - 8.0   Protein Ur, POC negative  negative mg/dL   Urobilinogen, UA 0.2 0.2 or 1.0 E.U./dL   Nitrite, UA Negative Negative   Leukocytes, UA Moderate (2+) (A) Negative          Assessment & Plan:   1. Urinary frequency Empiric treatment with nitrofurantoin. Await UCx. Has pyridium at home. - POCT urinalysis dipstick - Urine Culture - nitrofurantoin, macrocrystal-monohydrate, (MACROBID) 100 MG capsule; Take 1 capsule (100 mg total) by mouth 2 (two) times daily.  Dispense: 20 capsule; Refill: 0  2. Frequent UTI - Ambulatory referral to Urology    Return if symptoms worsen or fail to improve.   Fernande Bras, PA-C Primary Care at [facility]

## 2018-02-11 LAB — URINE CULTURE

## 2018-02-15 ENCOUNTER — Encounter: Payer: Self-pay | Admitting: Family Medicine

## 2018-02-18 MED FILL — CARTIA XT 120 MG CAPSULE SA: 120 | 90 days supply | Qty: 90 | Fill #1

## 2018-02-19 DIAGNOSIS — H52203 Unspecified astigmatism, bilateral: Secondary | ICD-10-CM | POA: Diagnosis not present

## 2018-02-19 DIAGNOSIS — H5212 Myopia, left eye: Secondary | ICD-10-CM | POA: Diagnosis not present

## 2018-02-19 DIAGNOSIS — H524 Presbyopia: Secondary | ICD-10-CM | POA: Diagnosis not present

## 2018-02-19 DIAGNOSIS — H5201 Hypermetropia, right eye: Secondary | ICD-10-CM | POA: Diagnosis not present

## 2018-03-22 MED FILL — VIT D2 1.25 MG (50,000 UNIT: 1.25 MG | 84 days supply | Qty: 12 | Fill #3

## 2018-03-24 ENCOUNTER — Telehealth: Payer: Self-pay | Admitting: Family Medicine

## 2018-03-24 NOTE — Telephone Encounter (Signed)
Patient was advised needs appointment

## 2018-03-24 NOTE — Telephone Encounter (Signed)
Copied from CRM [id number]. Topic: Quick Communication - Rx Refill/Question >> Mar 24, 2018 11:36 AM Gerrianne ScalePayne, Kamile Fassler L wrote: Medication: patient has a UTI and has an appt to a  Urologist on 04-20-18 and has symptoms again and would like to request an antibiotic for this her symptoms bad order blood in urine and frequent urination   Has the patient contacted their pharmacy? No. (Agent: If no, request that the patient contact the pharmacy for the refill.) (Agent: If yes, when and what did the pharmacy advise?)  Preferred Pharmacy (with phone number or street name): [facility] [place] - [address] [phone] (Phone) [phone] (Fax)      Agent: Please be advised that RX refills may take up to 3 business days. We ask that you follow-up with your pharmacy.

## 2018-03-24 NOTE — Telephone Encounter (Signed)
Please advise pt

## 2018-04-01 ENCOUNTER — Encounter: Payer: Self-pay | Admitting: Physician Assistant

## 2018-04-01 ENCOUNTER — Ambulatory Visit (INDEPENDENT_AMBULATORY_CARE_PROVIDER_SITE_OTHER): Payer: 59 | Admitting: Physician Assistant

## 2018-04-01 ENCOUNTER — Other Ambulatory Visit: Payer: Self-pay

## 2018-04-01 VITALS — BP 128/64 | HR 70 | Temp 98.7°F | Ht 67.0 in | Wt 140.0 lb

## 2018-04-01 DIAGNOSIS — R350 Frequency of micturition: Secondary | ICD-10-CM | POA: Diagnosis not present

## 2018-04-01 DIAGNOSIS — R82998 Other abnormal findings in urine: Secondary | ICD-10-CM | POA: Diagnosis not present

## 2018-04-01 DIAGNOSIS — N390 Urinary tract infection, site not specified: Secondary | ICD-10-CM | POA: Diagnosis not present

## 2018-04-01 LAB — POCT URINALYSIS DIP (MANUAL ENTRY)
BILIRUBIN UA: NEGATIVE
BILIRUBIN UA: NEGATIVE mg/dL
Blood, UA: NEGATIVE
GLUCOSE UA: NEGATIVE mg/dL
Nitrite, UA: NEGATIVE
PROTEIN UA: NEGATIVE mg/dL
Spec Grav, UA: 1.015 (ref 1.010–1.025)
Urobilinogen, UA: 0.2 E.U./dL
pH, UA: 8 (ref 5.0–8.0)

## 2018-04-01 MED ORDER — NITROFURANTOIN MONOHYD MACRO 100 MG PO CAPS: 100.0000 mg | ORAL_CAPSULE | Freq: Two times a day (BID) | ORAL | 0 refills | Status: DC

## 2018-04-01 NOTE — Patient Instructions (Addendum)
  Your results indicate you have a UTI. I have given you a prescription for an antibiotic. Please take with food. I have sent off a urine culture and we should have those results in 48 hours. If your symptoms worsen while you are awaiting these results or you develop fever, chills, flank pian, nausea and vomiting, please seek care immediately.    Urinary Tract Infection, Adult A urinary tract infection (UTI) is an infection of any part of the urinary tract. The urinary tract includes the:  Kidneys.  Ureters.  Bladder.  Urethra.  These organs make, store, and get rid of pee (urine) in the body. Follow these instructions at home:  Take over-the-counter and prescription medicines only as told by your doctor.  If you were prescribed an antibiotic medicine, take it as told by your doctor. Do not stop taking the antibiotic even if you start to feel better.  Avoid the following drinks: ? Alcohol. ? Caffeine. ? Tea. ? Carbonated drinks.  Drink enough fluid to keep your pee clear or pale yellow.  Keep all follow-up visits as told by your doctor. This is important.  Make sure to: ? Empty your bladder often and completely. Do not to hold pee for long periods of time. ? Empty your bladder before and after sex. ? Wipe from front to back after a bowel movement if you are female. Use each tissue one time when you wipe. Contact a doctor if:  You have back pain.  You have a fever.  You feel sick to your stomach (nauseous).  You throw up (vomit).  Your symptoms do not get better after 3 days.  Your symptoms go away and then come back. Get help right away if:  You have very bad back pain.  You have very bad lower belly (abdominal) pain.  You are throwing up and cannot keep down any medicines or water. This information is not intended to replace advice given to you by your health care provider. Make sure you discuss any questions you have with your health care provider. Document  Released: 02/25/2008 Document Revised: 02/14/2016 Document Reviewed: 07/30/2015 Elsevier Interactive Patient Education  2018 Elsevier Inc.    IF you received an x-ray today, you will receive an invoice from [facility]. Please contact [facility] at [phone] with questions or concerns regarding your invoice.   IF you received labwork today, you will receive an invoice from [facility]. Please contact [facility] at [phone] with questions or concerns regarding your invoice.   Our billing staff will not be able to assist you with questions regarding bills from these companies.  You will be contacted with the lab results as soon as they are available. The fastest way to get your results is to activate your My Chart account. Instructions are located on the last page of this paperwork. If you have not heard from us regarding the results in 2 weeks, please contact this office.     

## 2018-04-01 NOTE — Progress Notes (Signed)
04/01/2018 at 6:51 PM  Teresa Fitzgerald / DOB: 1956-03-20 / MRN: [id number]  The patient has HYPERCHOLESTEROLEMIA; SVT/ PSVT/ PAT; Asthma; History of cardiovascular disorder; Seasonal allergic rhinitis due to pollen; and Other nonrheumatic mitral valve disorders on their problem list.  SUBJECTIVE   Teresa Fitzgerald is a 62 y.o. female who complains of dysuria, urinary frequency and urinary urgency x 7 days. She denies flank pain, abdominal pain, genital rash, genital irritation and vaginal discharge. Has tried herbal with no relief. Most recent UTI prior to this was 02/09/18, urine cx with E.coli, sensitive to all agents, tx with macrobid. Prior to that:  E coli UTI 10/2017, sensitive to all tested agents, tx with SMX-TMP.UTI on 04/2017, also tx with SMX-TMP. Was actually referred to urology at last visit. Has appt   She  has a past medical history of Allergic rhinitis, Asthma, Hypercholesteremia, Mitral valve prolapse, Osteopenia, and SVT (supraventricular tachycardia) (HCC).    Medications reviewed and updated by myself where necessary, and exist elsewhere in the encounter.   Teresa Fitzgerald is allergic to oatmeal and other. She  reports that she has never smoked. She has never used smokeless tobacco. She reports that she drinks about 8.4 oz of alcohol per week. She reports that she does not use drugs. She  reports that she currently engages in sexual activity and has had partners who are Female. She reports using the following methods of birth control/protection: Surgical and Post-menopausal. The patient  has a past surgical history that includes Lumbar fusion (2000); EP study (12/11); and Tubal ligation (1988).  Her family history includes Allergies in her daughter and father; Asthma in her father and paternal grandfather; Hypertension in her mother; Lung cancer in her mother.  Review of Systems  Constitutional: Negative for chills and fever.  Gastrointestinal: Negative for nausea and vomiting.     OBJECTIVE  Her  height is 5\' 7"  (1.702 m) and weight is 140 lb (63.5 kg). Her oral temperature is 98.7 F (37.1 C). Her blood pressure is 128/64 and her pulse is 70. Her oxygen saturation is 98%.  The patient's body mass index is 21.93 kg/m.  Physical Exam  Constitutional: She is oriented to person, place, and time. She appears well-developed and well-nourished. No distress.  HENT:  Head: Normocephalic and atraumatic.  Eyes: Conjunctivae are normal.  Neck: Normal range of motion.  Pulmonary/Chest: Effort normal.  Abdominal: Soft. Normal appearance. There is tenderness (mild) in the suprapubic area. There is no CVA tenderness.  Neurological: She is alert and oriented to person, place, and time.  Skin: Skin is warm and dry.  Psychiatric: She has a normal mood and affect.  Vitals reviewed.   Results for orders placed or performed in visit on 04/01/18 (from the past 24 hour(s))  POCT urinalysis dipstick     Status: Abnormal   Collection Time: 04/01/18  5:24 PM  Result Value Ref Range   Color, UA yellow yellow   Clarity, UA clear clear   Glucose, UA negative negative mg/dL   Bilirubin, UA negative negative   Ketones, POC UA negative negative mg/dL   Spec Grav, UA 4.098 1.191 - 1.025   Blood, UA negative negative   pH, UA 8.0 5.0 - 8.0   Protein Ur, POC negative negative mg/dL   Urobilinogen, UA 0.2 0.2 or 1.0 E.U./dL   Nitrite, UA Negative Negative   Leukocytes, UA Small (1+) (A) Negative    ASSESSMENT & PLAN  Teresa Fitzgerald was seen today for  urinary frequency.  Diagnoses and all orders for this visit:  Urinary frequency -     POCT urinalysis dipstick  Leukocytes in urine -     Urinalysis, microscopic only -     Urine Culture -     nitrofurantoin, macrocrystal-monohydrate, (MACROBID) 100 MG capsule; Take 1 capsule (100 mg total) by mouth 2 (two) times daily.    Hx, UA, and urine micro suggestive of UTI. Will treat empirically at this time. Urine cx pending. Follow up  with urology as planned. The patient was advised to call or come back to clinic if she does not see an improvement in symptoms, or worsens with the above plan.   Benjiman CoreBrittany Ellianne Gowen, PA-C  Primary Care at [facility] 04/01/2018 6:51 PM

## 2018-04-02 LAB — URINALYSIS, MICROSCOPIC ONLY: CASTS: NONE SEEN /LPF

## 2018-04-03 LAB — URINE CULTURE

## 2018-04-20 DIAGNOSIS — B9620 Unspecified Escherichia coli [E. coli] as the cause of diseases classified elsewhere: Secondary | ICD-10-CM | POA: Diagnosis not present

## 2018-04-20 DIAGNOSIS — N3020 Other chronic cystitis without hematuria: Secondary | ICD-10-CM | POA: Diagnosis not present

## 2018-04-20 DIAGNOSIS — N390 Urinary tract infection, site not specified: Secondary | ICD-10-CM | POA: Diagnosis not present

## 2018-04-20 MED FILL — TRIMETHOPRIM 100 MG TABLET: 100 | 90 days supply | Qty: 90 | Fill #0

## 2018-05-21 MED FILL — DILTIAZEM 24HR ER 120 MG CA: 120 | 90 days supply | Qty: 90 | Fill #2

## 2018-06-22 MED FILL — VIT D2 1.25 MG (50,000 UNIT: 1.25 MG | 84 days supply | Qty: 12 | Fill #0

## 2018-08-03 DIAGNOSIS — N390 Urinary tract infection, site not specified: Secondary | ICD-10-CM | POA: Diagnosis not present

## 2018-08-03 DIAGNOSIS — N3000 Acute cystitis without hematuria: Secondary | ICD-10-CM | POA: Diagnosis not present

## 2018-08-03 DIAGNOSIS — B9620 Unspecified Escherichia coli [E. coli] as the cause of diseases classified elsewhere: Secondary | ICD-10-CM | POA: Diagnosis not present

## 2018-08-06 MED FILL — TRIMETHOPRIM 100 MG TABLET: 100 | 90 days supply | Qty: 90 | Fill #0

## 2018-08-17 ENCOUNTER — Ambulatory Visit: Payer: 59 | Admitting: Obstetrics & Gynecology

## 2018-08-23 MED FILL — CARTIA XT 120 MG CAPSULE: 120 | 90 days supply | Qty: 90 | Fill #3

## 2018-09-16 NOTE — Progress Notes (Deleted)
62 y.o. G3P3 Married White or Caucasian female here for annual exam.    Patient's last menstrual period was 09/22/2008 (approximate).          Sexually active: {yes no:314532}  The current method of family planning is {contraception:315051}.    Exercising: {yes no:314532}  {types:19826} Smoker:  {YES [id number]:22349}  Health Maintenance: Pap:  04/17/16 negative, HR HPV negative, 01/06/14 negative  History of abnormal Pap:  yes MMG:  07/07/17 Density C Bi-rads 1 Neg Colonoscopy:  04/23/16 Repeat in 10 years  BMD:   02/23/15 Osteopenia  TDaP: 07/23/17  Pneumonia vaccine(s):  ** Shingrix:   * ** Hep C testing: Donates blood Screening Labs: ***, Hb today: ***, Urine today: ***   reports that she has never smoked. She has never used smokeless tobacco. She reports current alcohol use of about 14.0 standard drinks of alcohol per week. She reports that she does not use drugs.  Past Medical History:  Diagnosis Date  . Allergic rhinitis   . Asthma    onset childhood; avoids trigger; Wert consultatoin in 2014. Dulera prescribed.  . Hypercholesteremia   . Mitral valve prolapse   . Osteopenia   . SVT (supraventricular tachycardia) (HCC)    presumed atrial tachycardia not inducible on EPS 12/11    Past Surgical History:  Procedure Laterality Date  . EP study  12/11   by JA, no inducible arrhythmia  . LUMBAR FUSION  2000  . TUBAL LIGATION  1988    Current Outpatient Medications  Medication Sig Dispense Refill  . albuterol (PROVENTIL HFA;VENTOLIN HFA) 108 (90 Base) MCG/ACT inhaler Inhale 2 puffs into the lungs every 6 (six) hours as needed. 1 Inhaler 1  . aspirin 81 MG tablet Take 81 mg by mouth daily.      . Calcium Carbonate (CALCARB 600) 1500 MG TABS Take 1 tablet by mouth 2 (two) times daily.      Marland Kitchen. diltiazem (CARTIA XT) 120 MG 24 hr capsule Take 1 capsule (120 mg total) by mouth daily. 90 capsule 3  . mometasone-formoterol (DULERA) 200-5 MCG/ACT AERO Inhale 2 puffs into the lungs 2 (two) times  daily. inhale 2 puffs into the lungs first thing in the morning once as needed 13 g 5  . nitrofurantoin, macrocrystal-monohydrate, (MACROBID) 100 MG capsule Take 1 capsule (100 mg total) by mouth 2 (two) times daily. 14 capsule 0  . vitamin B-12 (CYANOCOBALAMIN) 1000 MCG tablet Take 1,000 mcg by mouth daily.    . Vitamin D, Ergocalciferol, (DRISDOL) 50000 units CAPS capsule TAKE 1 CAPSULE BY MOUTH EVERY 7 DAYS 12 capsule 4   No current facility-administered medications for this visit.     Family History  Problem Relation Age of Onset  . Asthma Father   . Allergies Father   . Asthma Paternal Grandfather   . Lung cancer Mother        smoked  . Hypertension Mother   . Allergies Daughter   . Colon cancer Neg Hx   . Breast cancer Neg Hx     Review of Systems  Exam:   LMP 09/22/2008 (Approximate)   Height:      Ht Readings from Last 3 Encounters:  04/01/18 5\' 7"  (1.702 m)  02/09/18 5' 6.93" (1.7 m)  11/16/17 5' 6.93" (1.7 m)    General appearance: alert, cooperative and appears stated age Head: Normocephalic, without obvious abnormality, atraumatic Neck: no adenopathy, supple, symmetrical, trachea midline and thyroid {EXAM; THYROID:18604} Lungs: clear to auscultation bilaterally Breasts: {Exam; breast:13139::"normal  appearance, no masses or tenderness"} Heart: regular rate and rhythm Abdomen: soft, non-tender; bowel sounds normal; no masses,  no organomegaly Extremities: extremities normal, atraumatic, no cyanosis or edema Skin: Skin color, texture, turgor normal. No rashes or lesions Lymph nodes: Cervical, supraclavicular, and axillary nodes normal. No abnormal inguinal nodes palpated Neurologic: Grossly normal   Pelvic: External genitalia:  no lesions              Urethra:  normal appearing urethra with no masses, tenderness or lesions              Bartholins and Skenes: normal                 Vagina: normal appearing vagina with normal color and discharge, no lesions               Cervix: {exam; cervix:14595}              Pap taken: {yes no:314532} Bimanual Exam:  Uterus:  {exam; uterus:12215}              Adnexa: {exam; adnexa:12223}               Rectovaginal: Confirms               Anus:  normal sphincter tone, no lesions  Chaperone was present for exam.  A:  Well Woman with normal exam  P:   {plan; gyn:5269::"mammogram","pap smear","return annually or prn"}

## 2018-09-21 ENCOUNTER — Ambulatory Visit: Payer: 59 | Admitting: Obstetrics & Gynecology

## 2018-09-21 NOTE — Progress Notes (Signed)
62 y.o. G3P3 Married White or Caucasian female here for annual exam.  Having continued and recurrent issues with UTI.  Did see Dr. Marlou PorchHerrick.  She is now taking a probiotics and cranberry extract.  Is on 90 days of trimethoprim.  Stopped the antibiotic and within two weeks, she had a UTI.  We discussed estrogen vaginal cream and she is not interested in this.  We have discussed Vit E vaginal suppositories.  She would like to try this.    Patient's last menstrual period was 09/22/2008 (approximate).          Sexually active: Yes.    The current method of family planning is post menopausal status.    Exercising: Yes.    Yoga and walking  Smoker:  no  Health Maintenance: Pap:  04/17/16 negative, HR HPV negative, 01/06/14 negative  History of abnormal Pap:  yes MMG:  07/07/17 density C / Bi-rads 1 neg  Colonoscopy:  04/23/16 repeat in 10 years  BMD:   02/23/15 osteopenia  TDaP:  07/23/17  Pneumonia vaccine(s):  No  Shingrix:   N/A.  Has received shingles two times Hep C testing: donates blood  Screening Labs: PCP, Hb today: Urine today:none   reports that she has never smoked. She has never used smokeless tobacco. She reports current alcohol use of about 14.0 standard drinks of alcohol per week. She reports that she does not use drugs.  Past Medical History:  Diagnosis Date  . Allergic rhinitis   . Asthma    onset childhood; avoids trigger; Wert consultatoin in 2014. Dulera prescribed.  . Hypercholesteremia   . Mitral valve prolapse   . Osteopenia   . SVT (supraventricular tachycardia) ([facility])    presumed atrial tachycardia not inducible on EPS 12/11    Past Surgical History:  Procedure Laterality Date  . EP study  12/11   by JA, no inducible arrhythmia  . LUMBAR FUSION  2000  . TUBAL LIGATION  1988    Current Outpatient Medications  Medication Sig Dispense Refill  . albuterol (PROVENTIL HFA;VENTOLIN HFA) 108 (90 Base) MCG/ACT inhaler Inhale 2 puffs into the lungs every 6 (six) hours as  needed. 1 Inhaler 1  . aspirin 81 MG tablet Take 81 mg by mouth daily.      . Calcium Carbonate (CALCARB 600) 1500 MG TABS Take 1 tablet by mouth 2 (two) times daily.      Marland Kitchen. diltiazem (CARTIA XT) 120 MG 24 hr capsule Take 1 capsule (120 mg total) by mouth daily. 90 capsule 3  . mometasone-formoterol (DULERA) 200-5 MCG/ACT AERO Inhale 2 puffs into the lungs 2 (two) times daily. inhale 2 puffs into the lungs first thing in the morning once as needed 13 g 5  . nitrofurantoin, macrocrystal-monohydrate, (MACROBID) 100 MG capsule Take 1 capsule (100 mg total) by mouth 2 (two) times daily. 14 capsule 0  . vitamin B-12 (CYANOCOBALAMIN) 1000 MCG tablet Take 1,000 mcg by mouth daily.    . Vitamin D, Ergocalciferol, (DRISDOL) 50000 units CAPS capsule TAKE 1 CAPSULE BY MOUTH EVERY 7 DAYS 12 capsule 4   No current facility-administered medications for this visit.     Family History  Problem Relation Age of Onset  . Asthma Father   . Allergies Father   . Asthma Paternal Grandfather   . Lung cancer Mother        smoked  . Hypertension Mother   . Allergies Daughter   . Colon cancer Neg Hx   . Breast cancer  Neg Hx     Review of Systems  All other systems reviewed and are negative.   Exam:   Vitals:   09/23/18 0808  BP: 118/60  Pulse: 72  Resp: 16    General appearance: alert, cooperative and appears stated age Head: Normocephalic, without obvious abnormality, atraumatic Neck: no adenopathy, supple, symmetrical, trachea midline and thyroid normal to inspection and palpation Lungs: clear to auscultation bilaterally Breasts: normal appearance, no masses or tenderness Heart: regular rate and rhythm Abdomen: soft, non-tender; bowel sounds normal; no masses,  no organomegaly Extremities: extremities normal, atraumatic, no cyanosis or edema Skin: Skin color, texture, turgor normal. No rashes or lesions Lymph nodes: Cervical, supraclavicular, and axillary nodes normal. No abnormal inguinal  nodes palpated Neurologic: Grossly normal   Pelvic: External genitalia:  no lesions              Urethra:  normal appearing urethra with no masses, tenderness or lesions              Bartholins and Skenes: normal                 Vagina: normal appearing vagina with normal color and discharge, no lesions              Cervix: no lesions              Pap taken: Yes.   Bimanual Exam:  Uterus:  normal size, contour, position, consistency, mobility, non-tender              Adnexa: normal adnexa and no mass, fullness, tenderness               Rectovaginal: Confirms               Anus:  normal sphincter tone, no lesions  Chaperone was present for exam.  A:  Well Woman with normal exam PMP, no HRT Recurrent UTIs being followed by Dr. Marlou PorchHerrick Low Vit D H/O SVT, h/o cardiac ablation that was not successful, on diltiazem  P:   Mammogram guidelines reviewed.  She will schedule Order for BMD placed pap smear obtained today.  No HR HPV obtained as done in 7/17 Seeing Dr. Leretha PolSantiago in march and will do blood work then Declines shingrix vaccination due to having shingles twice in the past RF for Vit D 50k weekly to pharmacy.  #12/4RF. return annually or prn

## 2018-09-23 ENCOUNTER — Encounter: Payer: Self-pay | Admitting: Obstetrics & Gynecology

## 2018-09-23 ENCOUNTER — Ambulatory Visit (INDEPENDENT_AMBULATORY_CARE_PROVIDER_SITE_OTHER): Payer: 59 | Admitting: Obstetrics & Gynecology

## 2018-09-23 ENCOUNTER — Other Ambulatory Visit: Payer: Self-pay

## 2018-09-23 ENCOUNTER — Other Ambulatory Visit: Payer: Self-pay | Admitting: Obstetrics & Gynecology

## 2018-09-23 ENCOUNTER — Other Ambulatory Visit (HOSPITAL_COMMUNITY)
Admission: RE | Admit: 2018-09-23 | Discharge: 2018-09-23 | Disposition: A | Discharge status: Home / Self Care | Payer: 59 | Source: Ambulatory Visit | Attending: Obstetrics & Gynecology | Admitting: Obstetrics & Gynecology

## 2018-09-23 VITALS — BP 118/60 | HR 72 | Resp 16 | Ht 66.25 in | Wt 138.8 lb

## 2018-09-23 DIAGNOSIS — M8580 Other specified disorders of bone density and structure, unspecified site: Secondary | ICD-10-CM | POA: Diagnosis not present

## 2018-09-23 DIAGNOSIS — Z01419 Encounter for gynecological examination (general) (routine) without abnormal findings: Secondary | ICD-10-CM | POA: Diagnosis not present

## 2018-09-23 DIAGNOSIS — Z124 Encounter for screening for malignant neoplasm of cervix: Secondary | ICD-10-CM

## 2018-09-23 DIAGNOSIS — Z1231 Encounter for screening mammogram for malignant neoplasm of breast: Secondary | ICD-10-CM

## 2018-09-23 MED ORDER — TRIMETHOPRIM HCL 50 MG/5ML PO SOLN: 100.0000 mg | Freq: Every day | ORAL | Status: DC

## 2018-09-23 MED ORDER — NONFORMULARY OR COMPOUNDED ITEM: 3 refills | Status: DC

## 2018-09-23 MED ORDER — VITAMIN D (ERGOCALCIFEROL) 1.25 MG (50000 UNIT) PO CAPS: ORAL_CAPSULE | ORAL | 4 refills | Status: DC

## 2018-09-23 MED FILL — VIT D2 1.25 MG (50,000 UNIT: 1.25 MG | 84 days supply | Qty: 12 | Fill #0

## 2018-09-23 NOTE — Patient Instructions (Signed)
Femdophilus vaginal/urinary tract health 

## 2018-09-24 LAB — CYTOLOGY - PAP: DIAGNOSIS: NEGATIVE

## 2018-10-08 ENCOUNTER — Ambulatory Visit
Admission: RE | Admit: 2018-10-08 | Discharge: 2018-10-08 | Disposition: A | Discharge status: Home / Self Care | Payer: 59 | Source: Ambulatory Visit | Attending: Obstetrics & Gynecology | Admitting: Obstetrics & Gynecology

## 2018-10-08 ENCOUNTER — Ambulatory Visit: Payer: 59 | Admitting: Obstetrics & Gynecology

## 2018-10-08 DIAGNOSIS — M810 Age-related osteoporosis without current pathological fracture: Secondary | ICD-10-CM | POA: Diagnosis not present

## 2018-10-08 DIAGNOSIS — Z780 Asymptomatic menopausal state: Secondary | ICD-10-CM | POA: Diagnosis not present

## 2018-10-08 DIAGNOSIS — M85852 Other specified disorders of bone density and structure, left thigh: Secondary | ICD-10-CM | POA: Diagnosis not present

## 2018-10-08 DIAGNOSIS — M8580 Other specified disorders of bone density and structure, unspecified site: Secondary | ICD-10-CM

## 2018-10-12 DIAGNOSIS — N3020 Other chronic cystitis without hematuria: Secondary | ICD-10-CM | POA: Diagnosis not present

## 2018-10-13 ENCOUNTER — Telehealth: Payer: Self-pay | Admitting: *Deleted

## 2018-10-13 NOTE — Telephone Encounter (Signed)
-----   Message from Jerene Bears, MD sent at 10/13/2018  2:39 PM EST ----- Please let pt know her BMD showed significant change in her bone density in her forearm and also in her hip.  The arm now showed osteoporosis.  T score changed from -1.7 to -2.7.  Although treatment is indicated, I'm not sure she will want this.  We should do some blood work for secondary causes of osteoporosis and make a plan for repeating the BMD.  Please schedule OV.  Thanks.

## 2018-10-13 NOTE — Telephone Encounter (Signed)
LM for pt to call back.

## 2018-10-14 MED FILL — SULFAMETHOXAZOLE-TMP DS TAB: 800-160 | 7 days supply | Qty: 14 | Fill #0

## 2018-10-14 MED FILL — NITROFURANTOIN MCR 50 MG CA: 50 | 90 days supply | Qty: 90 | Fill #0

## 2018-10-14 NOTE — Telephone Encounter (Signed)
Pt notified. Verbalized understanding. OV scheduled 10/18/18

## 2018-10-14 NOTE — Telephone Encounter (Signed)
Patient returned call to Reina. °

## 2018-10-18 ENCOUNTER — Encounter: Payer: Self-pay | Admitting: Obstetrics & Gynecology

## 2018-10-18 ENCOUNTER — Ambulatory Visit (INDEPENDENT_AMBULATORY_CARE_PROVIDER_SITE_OTHER): Payer: 59 | Admitting: Obstetrics & Gynecology

## 2018-10-18 VITALS — BP 120/76 | HR 88 | Resp 16 | Ht 66.25 in | Wt 138.0 lb

## 2018-10-18 DIAGNOSIS — M810 Age-related osteoporosis without current pathological fracture: Secondary | ICD-10-CM | POA: Diagnosis not present
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
## 2018-10-19 ENCOUNTER — Telehealth: Payer: Self-pay | Admitting: Family Medicine

## 2018-10-19 LAB — COMPREHENSIVE METABOLIC PANEL
ALT: 15 IU/L (ref 0–32)
AST: 16 IU/L (ref 0–40)
Albumin/Globulin Ratio: 2 (ref 1.2–2.2)
Albumin: 4.4 g/dL (ref 3.8–4.8)
Alkaline Phosphatase: 97 IU/L (ref 39–117)
BILIRUBIN TOTAL: 0.3 mg/dL (ref 0.0–1.2)
BUN/Creatinine Ratio: 16 (ref 12–28)
BUN: 10 mg/dL (ref 8–27)
CHLORIDE: 99 mmol/L (ref 96–106)
CO2: 22 mmol/L (ref 20–29)
Calcium: 10.2 mg/dL (ref 8.7–10.3)
Creatinine, Ser: 0.62 mg/dL (ref 0.57–1.00)
GFR calc non Af Amer: 97 mL/min/{1.73_m2} (ref >59)
GFR, EST AFRICAN AMERICAN: 112 mL/min/{1.73_m2} (ref >59)
Globulin, Total: 2.2 g/dL (ref 1.5–4.5)
Glucose: 89 mg/dL (ref 65–99)
Potassium: 4.2 mmol/L (ref 3.5–5.2)
Sodium: 136 mmol/L (ref 134–144)
TOTAL PROTEIN: 6.6 g/dL (ref 6.0–8.5)

## 2018-10-19 LAB — PARATHYROID HORMONE, INTACT (NO CA): PTH: 17 pg/mL (ref 15–65)

## 2018-10-19 LAB — TSH: TSH: 0.788 u[IU]/mL (ref 0.450–4.500)

## 2018-10-19 LAB — VITAMIN D 25 HYDROXY (VIT D DEFICIENCY, FRACTURES): VIT D 25 HYDROXY: 40.7 ng/mL (ref 30.0–100.0)

## 2018-10-19 NOTE — Telephone Encounter (Signed)
LVM for pt to call the office and reschedule their appt that was scheduled for 11/23/18 with Dr. Leretha Pol. Due to a provider meeting, Dr. Leretha Pol will be unavailable. When pt calls back, please reschedule at their convenience. Thank you!

## 2018-10-21 ENCOUNTER — Ambulatory Visit
Admission: RE | Admit: 2018-10-21 | Discharge: 2018-10-21 | Disposition: A | Discharge status: Home / Self Care | Payer: 59 | Source: Ambulatory Visit | Attending: Obstetrics & Gynecology | Admitting: Obstetrics & Gynecology

## 2018-10-21 DIAGNOSIS — M810 Age-related osteoporosis without current pathological fracture: Secondary | ICD-10-CM | POA: Insufficient documentation

## 2018-10-21 DIAGNOSIS — Z1231 Encounter for screening mammogram for malignant neoplasm of breast: Secondary | ICD-10-CM | POA: Diagnosis not present

## 2018-11-22 ENCOUNTER — Encounter: Payer: 59 | Admitting: Family Medicine

## 2018-11-23 ENCOUNTER — Encounter: Payer: 59 | Admitting: Family Medicine

## 2018-12-21 MED FILL — VIT D2 1.25 MG (50,000 UNIT: 1.25 MG | 84 days supply | Qty: 12 | Fill #0

## 2019-03-28 MED FILL — NITROFURANTOIN MCR 50 MG CA: 50 | 30 days supply | Qty: 30 | Fill #1

## 2019-04-01 MED FILL — VIT D2 1.25 MG (50,000 UNIT: 1.25 MG | 84 days supply | Qty: 12 | Fill #0

## 2019-07-11 MED FILL — VIT D2 1.25 MG (50,000 UNIT: 1.25 MG | 84 days supply | Qty: 12 | Fill #1

## 2019-10-03 ENCOUNTER — Other Ambulatory Visit: Payer: Self-pay | Admitting: Obstetrics & Gynecology

## 2019-10-03 NOTE — Telephone Encounter (Signed)
Medication refill request: Vitamin D Last AEX:  09/23/18 SM Next AEX: 01/20/20 Last MMG (if hormonal medication request): 10/21/18 BIRADS 1 negative/density c Refill authorized: Please advise; order pended #90 w/0 refills if authorized

## 2019-10-05 MED FILL — VIT D2 1.25 MG (50,000 UNIT: 1.25 MG | 84 days supply | Qty: 12 | Fill #0

## 2019-10-31 IMAGING — MG DIGITAL SCREENING BILATERAL MAMMOGRAM WITH TOMO AND CAD
3 series · 3 of 11 positions shown · non-contrast
Comparison: Previous exam(s).

CLINICAL DATA: Screening.

EXAM:
DIGITAL SCREENING BILATERAL MAMMOGRAM WITH TOMO AND CAD

[L CC synth-2D]
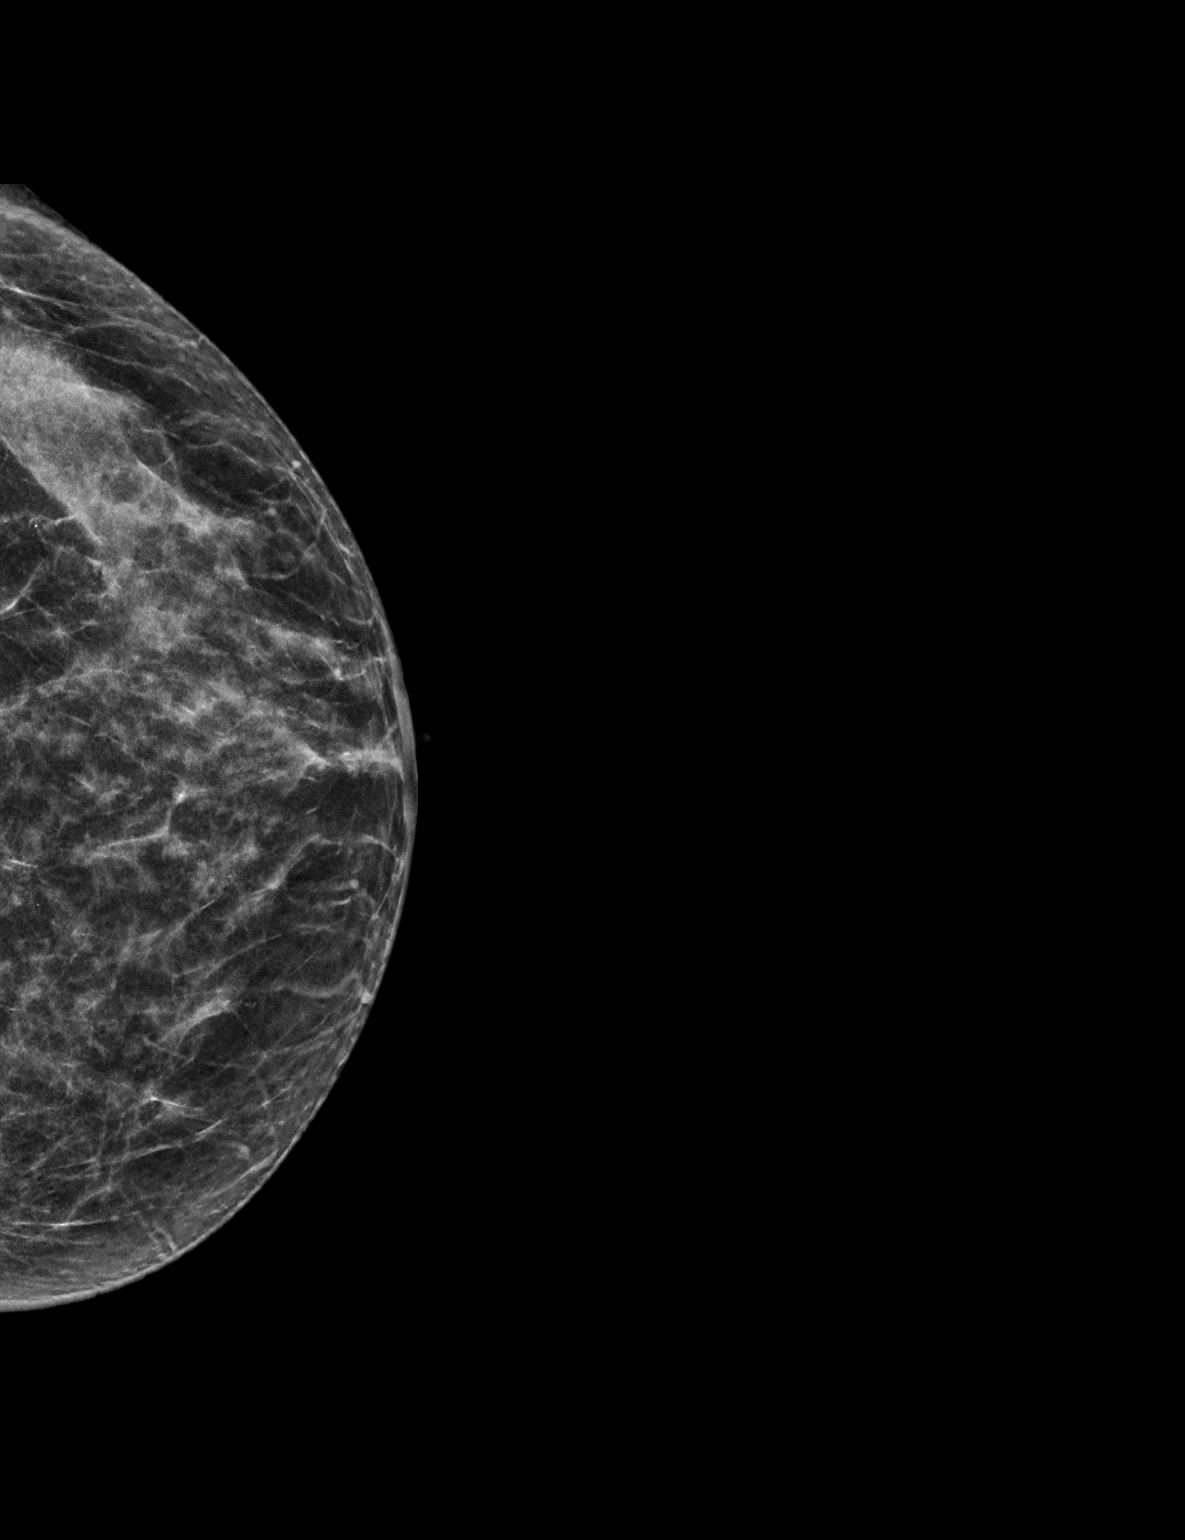

[L MLO tomo · tomo slice 25/50.0]
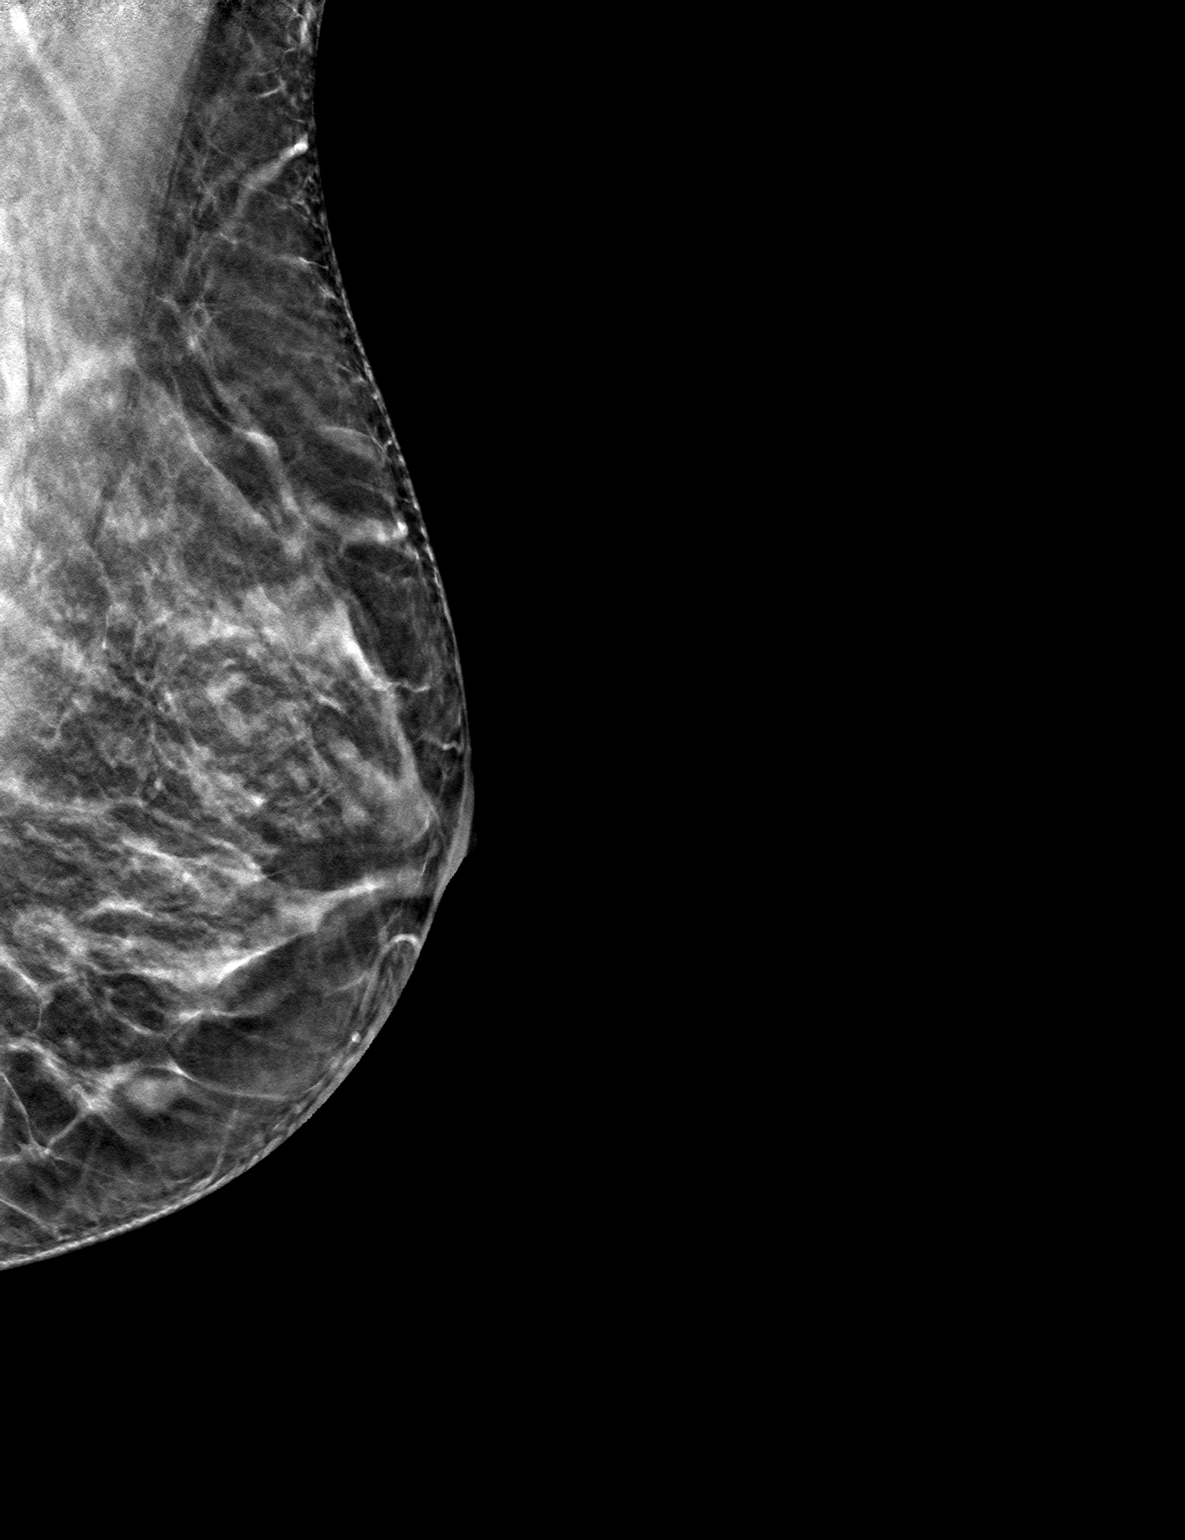

[R MLO tomo · tomo slice 23/44.0]
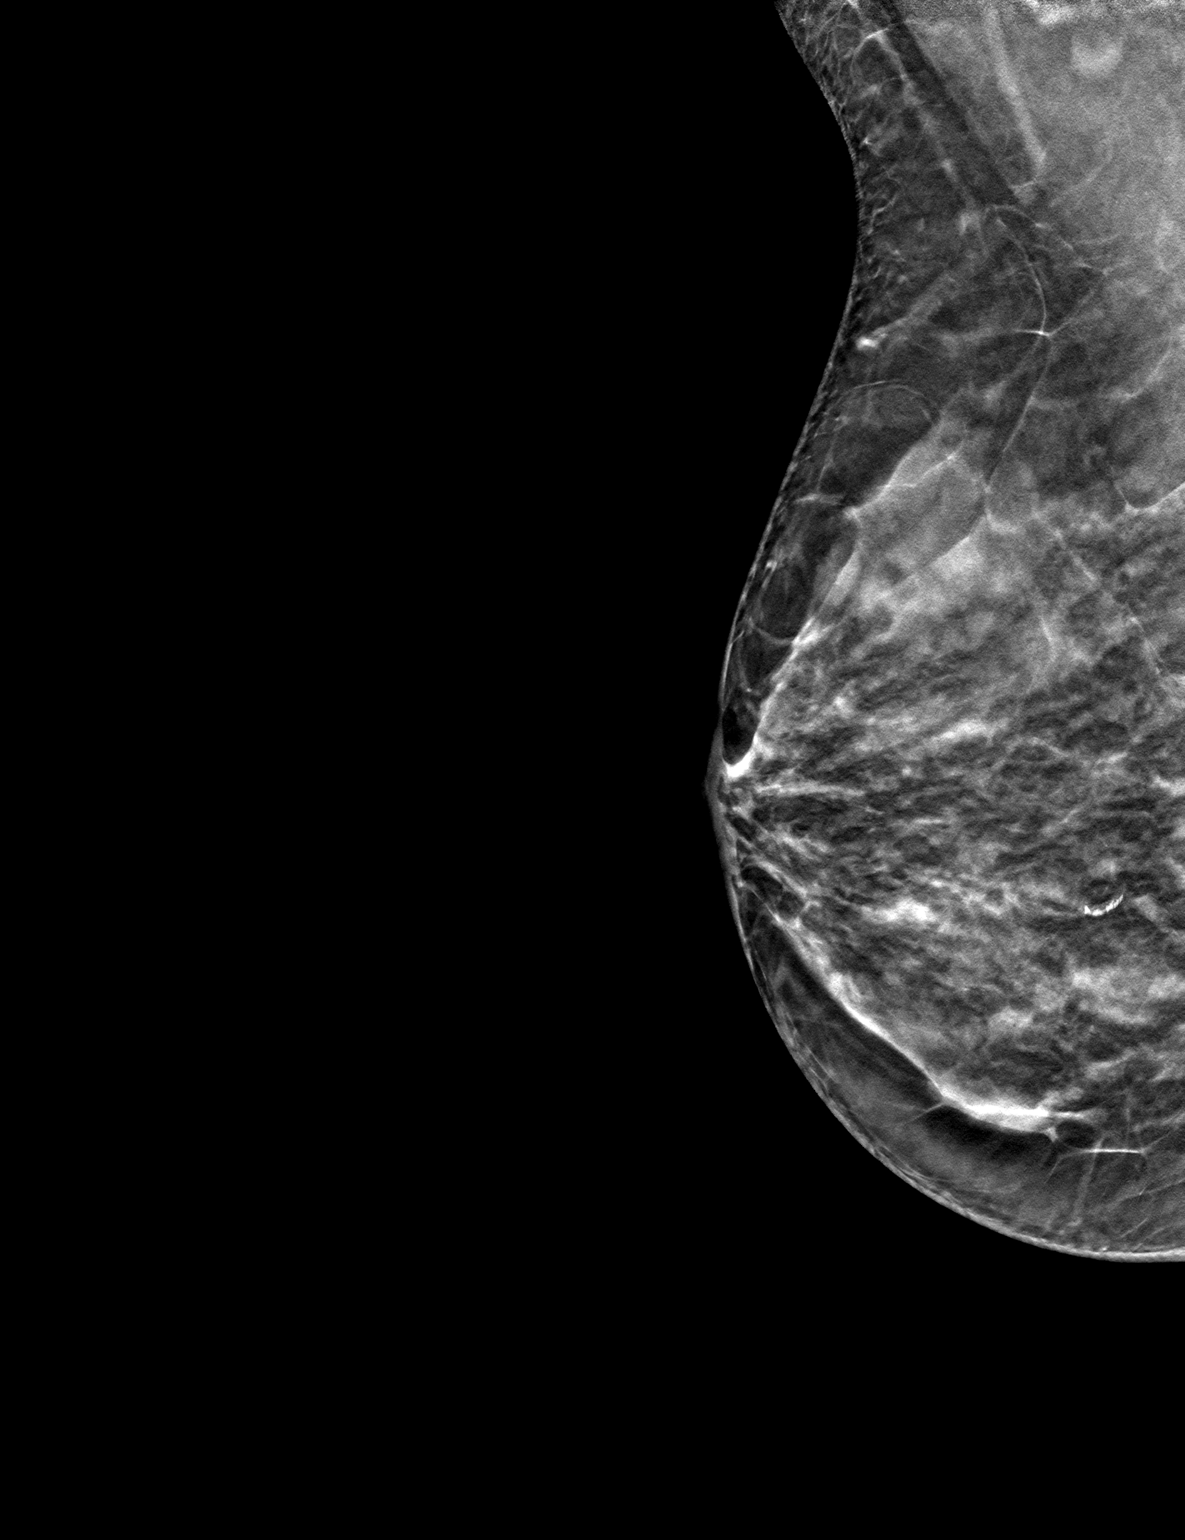

[3 of 11 positions shown; findings below may reference images not displayed]

ACR Breast Density Category c: The breast tissue is heterogeneously
dense, which may obscure small masses.
FINDINGS: There are no findings suspicious for malignancy. Images were
processed with CAD.
IMPRESSION: No mammographic evidence of malignancy. A result letter of this
screening mammogram will be mailed directly to the patient.

RECOMMENDATION:
Screening mammogram in one year. (Code:[id number])

BI-RADS CATEGORY  1: Negative.

## 2020-01-17 NOTE — Progress Notes (Deleted)
64 y.o. G3P3 Married White or Caucasian female here for annual exam.    Patient's last menstrual period was 09/22/2008 (approximate).          Sexually active: {yes no:314532}  The current method of family planning is post menopausal status.    Exercising: {yes no:314532}  {types:19826} Smoker:  {YES NO:22349}  Health Maintenance: Pap:  04-17-26 neg HPV HR neg, 09-23-2018 neg History of abnormal Pap:  yes MMG:  10-21-2018 category c density birads 1:neg Colonoscopy:  04-23-16 f/u 51yrs BMD:   2020 TDaP:  2018 Pneumonia vaccine(s):  no Shingrix:   *** Hep C testing: donated blood in the past Screening Labs: ***   reports that she has never smoked. She has never used smokeless tobacco. She reports current alcohol use of about 14.0 standard drinks of alcohol per week. She reports that she does not use drugs.  Past Medical History:  Diagnosis Date  . Allergic rhinitis   . Asthma    onset childhood; avoids trigger; Wert consultatoin in 2014. Dulera prescribed.  . Hypercholesteremia   . Mitral valve prolapse   . Osteopenia   . SVT (supraventricular tachycardia) ([facility])    presumed atrial tachycardia not inducible on EPS 12/11    Past Surgical History:  Procedure Laterality Date  . EP study  12/11   by JA, no inducible arrhythmia  . LUMBAR FUSION  2000  . TUBAL LIGATION  1988    Current Outpatient Medications  Medication Sig Dispense Refill  . albuterol (PROVENTIL HFA;VENTOLIN HFA) 108 (90 Base) MCG/ACT inhaler Inhale 2 puffs into the lungs every 6 (six) hours as needed. 1 Inhaler 1  . aspirin 81 MG tablet Take 81 mg by mouth daily.      . Calcium Carbonate (CALCARB 600) 1500 MG TABS Take 1 tablet by mouth 2 (two) times daily.      Marland Kitchen diltiazem (CARTIA XT) 120 MG 24 hr capsule Take 1 capsule (120 mg total) by mouth daily. 90 capsule 3  . mometasone-formoterol (DULERA) 200-5 MCG/ACT AERO Inhale 2 puffs into the lungs 2 (two) times daily. inhale 2 puffs into the lungs first thing in the  morning once as needed 13 g 5  . nitrofurantoin (MACRODANTIN) 50 MG capsule Take 1 capsule by mouth daily.    . NONFORMULARY OR COMPOUNDED ITEM Vit E vaginal suppositories.  200u/ml.  One pv three times weekly. 36 each 3  . vitamin B-12 (CYANOCOBALAMIN) 1000 MCG tablet Take 1,000 mcg by mouth daily.    . Vitamin D, Ergocalciferol, (DRISDOL) 1.25 MG (50000 UNIT) CAPS capsule TAKE 1 CAPSULE BY MOUTH EVERY 7 DAYS 12 capsule 0   No current facility-administered medications for this visit.    Family History  Problem Relation Age of Onset  . Asthma Father   . Allergies Father   . Asthma Paternal Grandfather   . Lung cancer Mother        smoked  . Hypertension Mother   . Allergies Daughter   . Colon cancer Neg Hx   . Breast cancer Neg Hx     Review of Systems  Exam:   LMP 09/22/2008 (Approximate)      General appearance: alert, cooperative and appears stated age Head: Normocephalic, without obvious abnormality, atraumatic Neck: no adenopathy, supple, symmetrical, trachea midline and thyroid {EXAM; THYROID:18604} Lungs: clear to auscultation bilaterally Breasts: {Exam; breast:13139::"normal appearance, no masses or tenderness"} Heart: regular rate and rhythm Abdomen: soft, non-tender; bowel sounds normal; no masses,  no organomegaly Extremities: extremities normal,  atraumatic, no cyanosis or edema Skin: Skin color, texture, turgor normal. No rashes or lesions Lymph nodes: Cervical, supraclavicular, and axillary nodes normal. No abnormal inguinal nodes palpated Neurologic: Grossly normal   Pelvic: External genitalia:  no lesions              Urethra:  normal appearing urethra with no masses, tenderness or lesions              Bartholins and Skenes: normal                 Vagina: normal appearing vagina with normal color and discharge, no lesions              Cervix: {exam; cervix:14595}              Pap taken: {yes no:314532} Bimanual Exam:  Uterus:  {exam; uterus:12215}               Adnexa: {exam; adnexa:12223}               Rectovaginal: Confirms               Anus:  normal sphincter tone, no lesions  Chaperone, ***Zenovia Jordan, CMA, was present for exam.  A:  Well Woman with normal exam  P:   {plan; gyn:5269::"mammogram","pap smear","return annually or prn"}

## 2020-01-20 ENCOUNTER — Ambulatory Visit: Payer: 59 | Admitting: Obstetrics & Gynecology

## 2020-10-22 ENCOUNTER — Other Ambulatory Visit: Payer: Self-pay

## 2020-10-22 ENCOUNTER — Ambulatory Visit: Payer: 59 | Admitting: Emergency Medicine

## 2020-10-22 ENCOUNTER — Encounter: Payer: Self-pay | Admitting: Emergency Medicine

## 2020-10-22 VITALS — BP 135/75 | HR 73 | Temp 97.6°F | Resp 16 | Ht 67.0 in | Wt 134.4 lb

## 2020-10-22 DIAGNOSIS — Z7689 Persons encountering health services in other specified circumstances: Secondary | ICD-10-CM

## 2020-10-22 DIAGNOSIS — M810 Age-related osteoporosis without current pathological fracture: Secondary | ICD-10-CM

## 2020-10-22 DIAGNOSIS — Z1231 Encounter for screening mammogram for malignant neoplasm of breast: Secondary | ICD-10-CM

## 2020-10-22 DIAGNOSIS — E7800 Pure hypercholesterolemia, unspecified: Secondary | ICD-10-CM | POA: Diagnosis not present

## 2020-10-22 DIAGNOSIS — Z130 Encounter for screening for diseases of the blood and blood-forming organs and certain disorders involving the immune mechanism: Secondary | ICD-10-CM

## 2020-10-22 NOTE — Patient Instructions (Addendum)
   If you have lab work done today you will be contacted with your lab results within the next 2 weeks.  If you have not heard from us then please contact us. The fastest way to get your results is to register for My Chart.   IF you received an x-ray today, you will receive an invoice from [facility]. Please contact [facility] at [phone] with questions or concerns regarding your invoice.   IF you received labwork today, you will receive an invoice from [facility]. Please contact [facility] at [phone] with questions or concerns regarding your invoice.   Our billing staff will not be able to assist you with questions regarding bills from these companies.  You will be contacted with the lab results as soon as they are available. The fastest way to get your results is to activate your My Chart account. Instructions are located on the last page of this paperwork. If you have not heard from us regarding the results in 2 weeks, please contact this office.     Health Maintenance, Female Adopting a healthy lifestyle and getting preventive care are important in promoting health and wellness. Ask your health care provider about:  The right schedule for you to have regular tests and exams.  Things you can do on your own to prevent diseases and keep yourself healthy. What should I know about diet, weight, and exercise? Eat a healthy diet  Eat a diet that includes plenty of vegetables, fruits, low-fat dairy products, and lean protein.  Do not eat a lot of foods that are high in solid fats, added sugars, or sodium.   Maintain a healthy weight Body mass index (BMI) is used to identify weight problems. It estimates body fat based on height and weight. Your health care provider can help determine your BMI and help you achieve or maintain a healthy weight. Get regular exercise Get regular exercise. This is one of the most important things you can do for your health. Most  adults should:  Exercise for at least 150 minutes each week. The exercise should increase your heart rate and make you sweat (moderate-intensity exercise).  Do strengthening exercises at least twice a week. This is in addition to the moderate-intensity exercise.  Spend less time sitting. Even light physical activity can be beneficial. Watch cholesterol and blood lipids Have your blood tested for lipids and cholesterol at 65 years of age, then have this test every 5 years. Have your cholesterol levels checked more often if:  Your lipid or cholesterol levels are high.  You are older than 65 years of age.  You are at high risk for heart disease. What should I know about cancer screening? Depending on your health history and family history, you may need to have cancer screening at various ages. This may include screening for:  Breast cancer.  Cervical cancer.  Colorectal cancer.  Skin cancer.  Lung cancer. What should I know about heart disease, diabetes, and high blood pressure? Blood pressure and heart disease  High blood pressure causes heart disease and increases the risk of stroke. This is more likely to develop in people who have high blood pressure readings, are of African descent, or are overweight.  Have your blood pressure checked: ? Every 3-5 years if you are 18-39 years of age. ? Every year if you are 40 years old or older. Diabetes Have regular diabetes screenings. This checks your fasting blood sugar level. Have the screening done:  Once every   three years after age 40 if you are at a normal weight and have a low risk for diabetes.  More often and at a younger age if you are overweight or have a high risk for diabetes. What should I know about preventing infection? Hepatitis B If you have a higher risk for hepatitis B, you should be screened for this virus. Talk with your health care provider to find out if you are at risk for hepatitis B infection. Hepatitis  C Testing is recommended for:  Everyone born from 1945 through 1965.  Anyone with known risk factors for hepatitis C. Sexually transmitted infections (STIs)  Get screened for STIs, including gonorrhea and chlamydia, if: ? You are sexually active and are younger than 65 years of age. ? You are older than 65 years of age and your health care provider tells you that you are at risk for this type of infection. ? Your sexual activity has changed since you were last screened, and you are at increased risk for chlamydia or gonorrhea. Ask your health care provider if you are at risk.  Ask your health care provider about whether you are at high risk for HIV. Your health care provider may recommend a prescription medicine to help prevent HIV infection. If you choose to take medicine to prevent HIV, you should first get tested for HIV. You should then be tested every 3 months for as long as you are taking the medicine. Pregnancy  If you are about to stop having your period (premenopausal) and you may become pregnant, seek counseling before you get pregnant.  Take 400 to 800 micrograms (mcg) of folic acid every day if you become pregnant.  Ask for birth control (contraception) if you want to prevent pregnancy. Osteoporosis and menopause Osteoporosis is a disease in which the bones lose minerals and strength with aging. This can result in bone fractures. If you are 65 years old or older, or if you are at risk for osteoporosis and fractures, ask your health care provider if you should:  Be screened for bone loss.  Take a calcium or vitamin D supplement to lower your risk of fractures.  Be given hormone replacement therapy (HRT) to treat symptoms of menopause. Follow these instructions at home: Lifestyle  Do not use any products that contain nicotine or tobacco, such as cigarettes, e-cigarettes, and chewing tobacco. If you need help quitting, ask your health care provider.  Do not use street  drugs.  Do not share needles.  Ask your health care provider for help if you need support or information about quitting drugs. Alcohol use  Do not drink alcohol if: ? Your health care provider tells you not to drink. ? You are pregnant, may be pregnant, or are planning to become pregnant.  If you drink alcohol: ? Limit how much you use to 0-1 drink a day. ? Limit intake if you are breastfeeding.  Be aware of how much alcohol is in your drink. In the [place] one drink equals one 12 oz bottle of beer (355 mL), one 5 oz glass of wine (148 mL), or one 1 oz glass of hard liquor (44 mL). General instructions  Schedule regular health, dental, and eye exams.  Stay current with your vaccines.  Tell your health care provider if: ? You often feel depressed. ? You have ever been abused or do not feel safe at home. Summary  Adopting a healthy lifestyle and getting preventive care are important in promoting health and wellness.    Follow your health care provider's instructions about healthy diet, exercising, and getting tested or screened for diseases.  Follow your health care provider's instructions on monitoring your cholesterol and blood pressure. This information is not intended to replace advice given to you by your health care provider. Make sure you discuss any questions you have with your health care provider. Document Revised: 09/01/2018 Document Reviewed: 09/01/2018 Elsevier Patient Education  2021 [employer]  

## 2020-10-22 NOTE — Progress Notes (Signed)
Teresa Fitzgerald 65 y.o.   Chief Complaint  Patient presents with  . Transitions Of Care  . Asthma    Per patient Hx of allergies since childhood    HISTORY OF PRESENT ILLNESS: This is a 65 y.o. female here to establish care with me, used to see Dr. Leretha Pol. Has history of osteoporosis, mitral valve prolapse with past history of supraventricular tachycardias, seasonal allergies triggering asthma, and high cholesterol levels in the past.  Patient is a vegan and presently only taking calcium and vitamin D supplementation. Has no complaints or medical concerns.  Medical health self grade: A. Fully vaccinated against Covid with a booster.  Needs referral for mammogram. Up-to-date with colonoscopy.  HPI   Prior to Admission medications   Medication Sig Start Date End Date Taking? Authorizing Provider  albuterol (PROVENTIL HFA;VENTOLIN HFA) 108 (90 Base) MCG/ACT inhaler Inhale 2 puffs into the lungs every 6 (six) hours as needed. 03/27/17  Yes Ethelda Chick, MD  aspirin 81 MG tablet Take 81 mg by mouth daily.   Yes [provider]  calcium carbonate (OSCAL) 1500 (600 Ca) MG TABS tablet Take 1 tablet by mouth 2 (two) times daily.   Yes [provider]  mometasone-formoterol (DULERA) 200-5 MCG/ACT AERO Inhale 2 puffs into the lungs 2 (two) times daily. inhale 2 puffs into the lungs first thing in the morning once as needed 03/27/17  Yes Ethelda Chick, MD  vitamin B-12 (CYANOCOBALAMIN) 1000 MCG tablet Take 1,000 mcg by mouth daily.   Yes [provider]  Vitamin D, Ergocalciferol, (DRISDOL) 1.25 MG (50000 UNIT) CAPS capsule TAKE 1 CAPSULE BY MOUTH EVERY 7 DAYS 10/05/19  Yes Jerene Bears, MD  diltiazem (CARTIA XT) 120 MG 24 hr capsule Take 1 capsule (120 mg total) by mouth daily. 11/16/17   Ethelda Chick, MD  nitrofurantoin (MACRODANTIN) 50 MG capsule Take 1 capsule by mouth daily. 10/14/18   [provider]  NONFORMULARY OR COMPOUNDED ITEM Vit E vaginal  suppositories.  200u/ml.  One pv three times weekly. 09/23/18   Jerene Bears, MD    Allergies  Allergen Reactions  . Oatmeal     unknown  . Other     ALL Tree nuts - swelling of mouth and throat, hives    Patient Active Problem List   Diagnosis Date Noted  . Age-related osteoporosis without current pathological fracture 10/21/2018  . Seasonal allergic rhinitis due to pollen 04/11/2017  . Other nonrheumatic mitral valve disorders 01/08/2016  . SVT/ PSVT/ PAT 12/28/2009  . Asthma 12/27/2009  . HYPERCHOLESTEROLEMIA 10/26/2009    Past Medical History:  Diagnosis Date  . Allergic rhinitis   . Allergy    [employer] 10/20/2020  . Asthma    onset childhood; avoids trigger; [facility] consultatoin in 2014. Dulera prescribed.  . Asthma    [employer] 10/20/2020  . Hypercholesteremia   . Hyperlipidemia    [facility] 10/20/2020  . Mitral valve prolapse   . Osteopenia   . SVT (supraventricular tachycardia) (HCC)    presumed atrial tachycardia not inducible on EPS 12/11    Past Surgical History:  Procedure Laterality Date  . EP study  12/11   by JA, no inducible arrhythmia  . LUMBAR FUSION  2000  . SPINE SURGERY N/A    [employer] 10/20/2020  . TUBAL LIGATION  1988    Social History   Socioeconomic History  . Marital status: Married    Spouse name: Not on file  . Number of  children: 3  . Years of education: Not on file  . Highest education level: Not on file  Occupational History  . Occupation: Teacher, 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[employer]    Comment: case Production designer, theatre/television/film for [place]  Tobacco Use  . Smoking status: Never Smoker  . Smokeless tobacco: Never Used  Substance and Sexual Activity  . Alcohol use: Yes    Alcohol/week: 14.0 standard drinks    Types: 14 Standard drinks or equivalent per week    Comment: wine  . Drug use: No  . Sexual activity: Yes    Partners: Male    Birth control/protection: Surgical, Post-menopausal    Comment: BTL  Other Topics Concern  . 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  Walking 1.5-2.0 miles per day.   Social Determinants of Health   Financial Resource Strain: Not on file  Food Insecurity: Not on file  Transportation Needs: Not on file  Physical Activity: Not on file  Stress: Not on file  Social Connections: Not on file  Intimate Partner Violence: Not on file    Family History  Problem Relation Age of Onset  . Asthma Father   . Allergies Father   . Asthma Paternal Grandfather   . Lung cancer Mother        smoked  . Hypertension Mother   . Allergies Daughter   . Colon cancer Neg Hx   . Breast cancer Neg Hx      Review of Systems  Constitutional: Negative.  Negative for chills and fever.  HENT: Negative.  Negative for congestion and sore throat.   Respiratory: Negative.  Negative for cough and shortness of breath.   Cardiovascular: Negative.  Negative for chest pain and palpitations.  Gastrointestinal: Negative.  Negative for abdominal pain, diarrhea, nausea and vomiting.  Genitourinary: Negative.  Negative for dysuria and hematuria.  Musculoskeletal: Negative.  Negative for back pain, myalgias and neck pain.  Skin: Negative.  Negative for rash.  Neurological: Negative.  Negative for dizziness and headaches.  All other systems reviewed and are negative.    Physical Exam Vitals reviewed.  Constitutional:      Appearance: Normal appearance.  HENT:     Head: Normocephalic.  Eyes:     Extraocular Movements: Extraocular movements intact.     Pupils: Pupils are equal, round, and reactive to light.  Cardiovascular:     Rate and Rhythm: Normal rate and regular rhythm.     Pulses: Normal  pulses.     Heart sounds: Normal heart sounds.  Pulmonary:     Effort: Pulmonary effort is normal.     Breath sounds: Normal breath sounds.  Musculoskeletal:        General: Normal range of motion.     Cervical back: Normal range of motion and neck supple.  Skin:    General: Skin is warm and dry.     Capillary Refill: Capillary refill takes less than 2 seconds.  Neurological:     General: No focal deficit present.     Mental  Status: She is alert and oriented to person, place, and time.  Psychiatric:        Mood and Affect: Mood normal.        Behavior: Behavior normal.      ASSESSMENT & PLAN: Teresa Fitzgerald was seen today for transitions of care and asthma.  Diagnoses and all orders for this visit:  Encounter to establish care  Encounter for screening mammogram for malignant neoplasm of breast -     MM Digital Screening; Future  Age-related osteoporosis without current pathological fracture -     VITAMIN D 25 Hydroxy (Vit-D Deficiency, Fractures)  HYPERCHOLESTEROLEMIA -     Comprehensive metabolic panel -     Lipid panel  Screening for deficiency anemia -     CBC with Differential/Platelet    Patient Instructions       If you have lab work done today you will be contacted with your lab results within the next 2 weeks.  If you have not heard from us then please contact us. The fastest way to get your results is to register for My Chart.   IF you received an x-ray today, you will receive an invoice from [facility]. Please contact [facility] at [phone] with questions or concerns regarding your invoice.   IF you received labwork today, you will receive an invoice from [facility]. Please contact [facility] at [phone] with questions or concerns regarding your invoice.   Our billing staff will not be able to assist you with questions regarding bills from these companies.  You will be contacted with the lab results as soon as they are available. The  fastest way to get your results is to activate your My Chart account. Instructions are located on the last page of this paperwork. If you have not heard from us regarding the results in 2 weeks, please contact this office.     Health Maintenance, Female Adopting a healthy lifestyle and getting preventive care are important in promoting health and wellness. Ask your health care provider about:  The right schedule for you to have regular tests and exams.  Things you can do on your own to prevent diseases and keep yourself healthy. What should I know about diet, weight, and exercise? Eat a healthy diet  Eat a diet that includes plenty of vegetables, fruits, low-fat dairy products, and lean protein.  Do not eat a lot of foods that are high in solid fats, added sugars, or sodium.   Maintain a healthy weight Body mass index (BMI) is used to identify weight problems. It estimates body fat based on height and weight. Your health care provider can help determine your BMI and help you achieve or maintain a healthy weight. Get regular exercise Get regular exercise. This is one of the most important things you can do for your health. Most adults should:  Exercise for at least 150 minutes each week. The exercise should increase your heart rate and make you sweat (moderate-intensity exercise).  Do strengthening exercises at least twice a week. This is in addition to the moderate-intensity exercise.  Spend less time sitting. Even light physical activity can be beneficial. Watch cholesterol and blood lipids Have your blood tested for lipids and cholesterol at 65 years of age, then have this test every 5 years. Have your cholesterol levels checked more often if:  Your lipid or cholesterol levels are high.  You are older than 65 years of age.  You are at high risk for heart disease. What  should I know about cancer screening? Depending on your health history and family history, you may need to have  cancer screening at various ages. This may include screening for:  Breast cancer.  Cervical cancer.  Colorectal cancer.  Skin cancer.  Lung cancer. What should I know about heart disease, diabetes, and high blood pressure? Blood pressure and heart disease  High blood pressure causes heart disease and increases the risk of stroke. This is more likely to develop in people who have high blood pressure readings, are of African descent, or are overweight.  Have your blood pressure checked: ? Every 3-5 years if you are 39-65 years of age. ? Every year if you are 22 years old or older. Diabetes Have regular diabetes screenings. This checks your fasting blood sugar level. Have the screening done:  Once every three years after age 10 if you are at a normal weight and have a low risk for diabetes.  More often and at a younger age if you are overweight or have a high risk for diabetes. What should I know about preventing infection? Hepatitis B If you have a higher risk for hepatitis B, you should be screened for this virus. Talk with your health care provider to find out if you are at risk for hepatitis B infection. Hepatitis C Testing is recommended for:  Everyone born from [birth year] through 1965.  Anyone with known risk factors for hepatitis C. Sexually transmitted infections (STIs)  Get screened for STIs, including gonorrhea and chlamydia, if: ? You are sexually active and are younger than 66 years of age. ? You are older than 65 years of age and your health care provider tells you that you are at risk for this type of infection. ? Your sexual activity has changed since you were last screened, and you are at increased risk for chlamydia or gonorrhea. Ask your health care provider if you are at risk.  Ask your health care provider about whether you are at high risk for HIV. Your health care provider may recommend a prescription medicine to help prevent HIV infection. If you choose to take  medicine to prevent HIV, you should first get tested for HIV. You should then be tested every 3 months for as long as you are taking the medicine. Pregnancy  If you are about to stop having your period (premenopausal) and you may become pregnant, seek counseling before you get pregnant.  Take 400 to 800 micrograms (mcg) of folic acid every day if you become pregnant.  Ask for birth control (contraception) if you want to prevent pregnancy. Osteoporosis and menopause Osteoporosis is a disease in which the bones lose minerals and strength with aging. This can result in bone fractures. If you are 74 years old or older, or if you are at risk for osteoporosis and fractures, ask your health care provider if you should:  Be screened for bone loss.  Take a calcium or vitamin D supplement to lower your risk of fractures.  Be given hormone replacement therapy (HRT) to treat symptoms of menopause. Follow these instructions at home: Lifestyle  Do not use any products that contain nicotine or tobacco, such as cigarettes, e-cigarettes, and chewing tobacco. If you need help quitting, ask your health care provider.  Do not use street drugs.  Do not share needles.  Ask your health care provider for help if you need support or information about quitting drugs. Alcohol use  Do not drink alcohol if: ? Your health care provider  tells you not to drink. ? You are pregnant, may be pregnant, or are planning to become pregnant.  If you drink alcohol: ? Limit how much you use to 0-1 drink a day. ? Limit intake if you are breastfeeding.  Be aware of how much alcohol is in your drink. In the [place] one drink equals one 12 oz bottle of beer (355 mL), one 5 oz glass of wine (148 mL), or one 1 oz glass of hard liquor (44 mL). General instructions  Schedule regular health, dental, and eye exams.  Stay current with your vaccines.  Tell your health care provider if: ? You often feel depressed. ? You have  ever been abused or do not feel safe at home. Summary  Adopting a healthy lifestyle and getting preventive care are important in promoting health and wellness.  Follow your health care provider's instructions about healthy diet, exercising, and getting tested or screened for diseases.  Follow your health care provider's instructions on monitoring your cholesterol and blood pressure. This information is not intended to replace advice given to you by your health care provider. Make sure you discuss any questions you have with your health care provider. Document Revised: 09/01/2018 Document Reviewed: 09/01/2018 Elsevier Patient Education  2021 Elsevier Inc.      Edwina Barth, MD [facility]

## 2020-10-23 ENCOUNTER — Other Ambulatory Visit: Payer: Self-pay | Admitting: Emergency Medicine

## 2020-10-23 DIAGNOSIS — E7800 Pure hypercholesterolemia, unspecified: Secondary | ICD-10-CM

## 2020-10-23 LAB — LIPID PANEL
Chol/HDL Ratio: 6.1 ratio — ABNORMAL HIGH (ref 0.0–4.4)
Cholesterol, Total: 238 mg/dL — ABNORMAL HIGH (ref 100–199)
HDL: 39 mg/dL — ABNORMAL LOW (ref >39)
LDL Chol Calc (NIH): 168 mg/dL — ABNORMAL HIGH (ref 0–99)
Triglycerides: 170 mg/dL — ABNORMAL HIGH (ref 0–149)
VLDL Cholesterol Cal: 31 mg/dL (ref 5–40)

## 2020-10-23 LAB — CBC WITH DIFFERENTIAL/PLATELET
Basophils Absolute: 0 10*3/uL (ref 0.0–0.2)
Basos: 1 %
EOS (ABSOLUTE): 0.1 10*3/uL (ref 0.0–0.4)
Eos: 1 %
Hematocrit: 38.4 % (ref 34.0–46.6)
Hemoglobin: 13.5 g/dL (ref 11.1–15.9)
Immature Grans (Abs): 0 10*3/uL (ref 0.0–0.1)
Immature Granulocytes: 0 %
Lymphocytes Absolute: 1.8 10*3/uL (ref 0.7–3.1)
Lymphs: 31 %
MCH: 31.5 pg (ref 26.6–33.0)
MCHC: 35.2 g/dL (ref 31.5–35.7)
MCV: 90 fL (ref 79–97)
Monocytes Absolute: 0.4 10*3/uL (ref 0.1–0.9)
Monocytes: 7 %
Neutrophils Absolute: 3.5 10*3/uL (ref 1.4–7.0)
Neutrophils: 60 %
Platelets: 285 10*3/uL (ref 150–450)
RBC: 4.28 x10E6/uL (ref 3.77–5.28)
RDW: 11.4 % — ABNORMAL LOW (ref 11.7–15.4)
WBC: 5.9 10*3/uL (ref 3.4–10.8)

## 2020-10-23 LAB — COMPREHENSIVE METABOLIC PANEL
ALT: 18 IU/L (ref 0–32)
AST: 16 IU/L (ref 0–40)
Albumin/Globulin Ratio: 2.1 (ref 1.2–2.2)
Albumin: 4.9 g/dL — ABNORMAL HIGH (ref 3.8–4.8)
Alkaline Phosphatase: 109 IU/L (ref 44–121)
BUN/Creatinine Ratio: 9 — ABNORMAL LOW (ref 12–28)
BUN: 6 mg/dL — ABNORMAL LOW (ref 8–27)
Bilirubin Total: 0.8 mg/dL (ref 0.0–1.2)
CO2: 21 mmol/L (ref 20–29)
Calcium: 9.9 mg/dL (ref 8.7–10.3)
Chloride: 99 mmol/L (ref 96–106)
Creatinine, Ser: 0.68 mg/dL (ref 0.57–1.00)
GFR calc Af Amer: 107 mL/min/{1.73_m2} (ref >59)
GFR calc non Af Amer: 93 mL/min/{1.73_m2} (ref >59)
Globulin, Total: 2.3 g/dL (ref 1.5–4.5)
Glucose: 98 mg/dL (ref 65–99)
Potassium: 4.7 mmol/L (ref 3.5–5.2)
Sodium: 135 mmol/L (ref 134–144)
Total Protein: 7.2 g/dL (ref 6.0–8.5)

## 2020-10-23 LAB — VITAMIN D 25 HYDROXY (VIT D DEFICIENCY, FRACTURES): Vit D, 25-Hydroxy: 38.3 ng/mL (ref 30.0–100.0)

## 2020-10-23 MED ORDER — ROSUVASTATIN CALCIUM 5 MG PO TABS: 5.0000 mg | ORAL_TABLET | Freq: Every day | ORAL | 3 refills | Status: DC

## 2021-03-26 DIAGNOSIS — H5201 Hypermetropia, right eye: Secondary | ICD-10-CM | POA: Diagnosis not present

## 2021-03-26 DIAGNOSIS — H52203 Unspecified astigmatism, bilateral: Secondary | ICD-10-CM | POA: Diagnosis not present

## 2021-03-26 DIAGNOSIS — H5212 Myopia, left eye: Secondary | ICD-10-CM | POA: Diagnosis not present

## 2021-03-26 DIAGNOSIS — H2513 Age-related nuclear cataract, bilateral: Secondary | ICD-10-CM | POA: Diagnosis not present

## 2021-03-26 DIAGNOSIS — H524 Presbyopia: Secondary | ICD-10-CM | POA: Diagnosis not present

## 2021-04-22 ENCOUNTER — Other Ambulatory Visit: Payer: Self-pay

## 2021-04-22 ENCOUNTER — Encounter: Payer: Self-pay | Admitting: Emergency Medicine

## 2021-04-22 ENCOUNTER — Ambulatory Visit (INDEPENDENT_AMBULATORY_CARE_PROVIDER_SITE_OTHER): Payer: Medicare Other | Admitting: Emergency Medicine

## 2021-04-22 VITALS — BP 138/80 | HR 66 | Temp 98.0°F | Ht 67.0 in | Wt 131.0 lb

## 2021-04-22 DIAGNOSIS — Z8639 Personal history of other endocrine, nutritional and metabolic disease: Secondary | ICD-10-CM

## 2021-04-22 DIAGNOSIS — E785 Hyperlipidemia, unspecified: Secondary | ICD-10-CM | POA: Diagnosis not present

## 2021-04-22 DIAGNOSIS — M810 Age-related osteoporosis without current pathological fracture: Secondary | ICD-10-CM

## 2021-04-22 DIAGNOSIS — Z8709 Personal history of other diseases of the respiratory system: Secondary | ICD-10-CM | POA: Diagnosis not present

## 2021-04-22 DIAGNOSIS — J4540 Moderate persistent asthma, uncomplicated: Secondary | ICD-10-CM

## 2021-04-22 LAB — CBC WITH DIFFERENTIAL/PLATELET
Basophils Absolute: 0 10*3/uL (ref 0.0–0.1)
Basophils Relative: 0.6 % (ref 0.0–3.0)
Eosinophils Absolute: 0.1 10*3/uL (ref 0.0–0.7)
Eosinophils Relative: 1.2 % (ref 0.0–5.0)
HCT: 39.1 % (ref 36.0–46.0)
Hemoglobin: 13.1 g/dL (ref 12.0–15.0)
Lymphocytes Relative: 30.1 % (ref 12.0–46.0)
Lymphs Abs: 1.7 10*3/uL (ref 0.7–4.0)
MCHC: 33.5 g/dL (ref 30.0–36.0)
MCV: 93.4 fl (ref 78.0–100.0)
Monocytes Absolute: 0.4 10*3/uL (ref 0.1–1.0)
Monocytes Relative: 7.5 % (ref 3.0–12.0)
Neutro Abs: 3.4 10*3/uL (ref 1.4–7.7)
Neutrophils Relative %: 60.6 % (ref 43.0–77.0)
Platelets: 250 10*3/uL (ref 150.0–400.0)
RBC: 4.18 Mil/uL (ref 3.87–5.11)
RDW: 12.4 % (ref 11.5–15.5)
WBC: 5.5 10*3/uL (ref 4.0–10.5)

## 2021-04-22 LAB — LIPID PANEL
Cholesterol: 154 mg/dL (ref 0–200)
HDL: 38.7 mg/dL — ABNORMAL LOW (ref >39.00)
LDL Cholesterol: 90 mg/dL (ref 0–99)
NonHDL: 114.91
Total CHOL/HDL Ratio: 4
Triglycerides: 127 mg/dL (ref 0.0–149.0)
VLDL: 25.4 mg/dL (ref 0.0–40.0)

## 2021-04-22 LAB — COMPREHENSIVE METABOLIC PANEL
ALT: 20 U/L (ref 0–35)
AST: 20 U/L (ref 0–37)
Albumin: 4.5 g/dL (ref 3.5–5.2)
Alkaline Phosphatase: 83 U/L (ref 39–117)
BUN: 7 mg/dL (ref 6–23)
CO2: 27 mEq/L (ref 19–32)
Calcium: 9.6 mg/dL (ref 8.4–10.5)
Chloride: 101 mEq/L (ref 96–112)
Creatinine, Ser: 0.57 mg/dL (ref 0.40–1.20)
GFR: 95.6 mL/min (ref >60.00)
Glucose, Bld: 93 mg/dL (ref 70–99)
Potassium: 4.3 mEq/L (ref 3.5–5.1)
Sodium: 137 mEq/L (ref 135–145)
Total Bilirubin: 0.9 mg/dL (ref 0.2–1.2)
Total Protein: 7.2 g/dL (ref 6.0–8.3)

## 2021-04-22 LAB — VITAMIN D 25 HYDROXY (VIT D DEFICIENCY, FRACTURES): VITD: 47.4 ng/mL (ref 30.00–100.00)

## 2021-04-22 LAB — VITAMIN B12: Vitamin B-12: 217 pg/mL (ref 211–911)

## 2021-04-22 MED ORDER — ALBUTEROL SULFATE HFA 108 (90 BASE) MCG/ACT IN AERS: 2.0000 | INHALATION_SPRAY | Freq: Four times a day (QID) | RESPIRATORY_TRACT | 1 refills | Status: AC | PRN

## 2021-04-22 MED ORDER — MOMETASONE FURO-FORMOTEROL FUM 200-5 MCG/ACT IN AERO: 2.0000 | INHALATION_SPRAY | Freq: Two times a day (BID) | RESPIRATORY_TRACT | 5 refills | Status: AC

## 2021-04-22 MED ORDER — VITAMIN D (ERGOCALCIFEROL) 1.25 MG (50000 UNIT) PO CAPS: 50000.0000 [IU] | ORAL_CAPSULE | ORAL | 3 refills | Status: DC

## 2021-04-22 NOTE — Patient Instructions (Signed)

## 2021-04-22 NOTE — Assessment & Plan Note (Signed)
Diet and nutrition discussed. Continue rosuvastatin 5 mg daily. Fasting lipid profile done today.

## 2021-04-22 NOTE — Progress Notes (Signed)
Teresa Fitzgerald 65 y.o.   Chief Complaint  Patient presents with   Follow-up    6 month recheck   Lab Results  Component Value Date   CHOL 238 (H) 10/22/2020   HDL 39 (L) 10/22/2020   LDLCALC 168 (H) 10/22/2020   TRIG 170 (H) 10/22/2020   CHOLHDL 6.1 (H) 10/22/2020   The 10-year ASCVD risk score Denman George DC Jr., et al., 2013) is: 8.1%   Values used to calculate the score:     Age: 51 years     Sex: Female     Is Non-Hispanic African American: No     Diabetic: No     Tobacco smoker: No     Systolic Blood Pressure: 138 mmHg     Is BP treated: No     HDL Cholesterol: 39 mg/dL     Total Cholesterol: 238 mg/dL Lab Results  Component Value Date   CREATININE 0.68 10/22/2020   BUN 6 (L) 10/22/2020   NA 135 10/22/2020   K 4.7 10/22/2020   CL 99 10/22/2020   CO2 21 10/22/2020    HISTORY OF PRESENT ILLNESS: This is a 65 y.o. female with history of dyslipidemia on rosuvastatin 5 mg daily, here for follow-up. Also has history of age-related osteoporosis without fracture.  Presently on vitamin D and calcium Vegan with a healthy lifestyle.  Physically active. History of asthma, seasonal. Has no complaints or medical concerns today. Scheduled for mammogram tomorrow.  HPI   Prior to Admission medications   Medication Sig Start Date End Date Taking? Authorizing Provider  albuterol (PROVENTIL HFA;VENTOLIN HFA) 108 (90 Base) MCG/ACT inhaler Inhale 2 puffs into the lungs every 6 (six) hours as needed. 03/27/17  Yes Ethelda Chick, MD  aspirin 81 MG tablet Take 81 mg by mouth daily.   Yes [provider]  calcium carbonate (OSCAL) 1500 (600 Ca) MG TABS tablet Take 1 tablet by mouth 2 (two) times daily.   Yes [provider]  mometasone-formoterol (DULERA) 200-5 MCG/ACT AERO Inhale 2 puffs into the lungs 2 (two) times daily. inhale 2 puffs into the lungs first thing in the morning once as needed 03/27/17  Yes Ethelda Chick, MD  rosuvastatin (CRESTOR) 5 MG tablet Take 1  tablet (5 mg total) by mouth daily. 10/23/20  Yes Malacai Grantz, Eilleen Kempf, MD  vitamin B-12 (CYANOCOBALAMIN) 1000 MCG tablet Take 1,000 mcg by mouth daily.   Yes [provider]  Vitamin D, Ergocalciferol, (DRISDOL) 1.25 MG (50000 UNIT) CAPS capsule TAKE 1 CAPSULE BY MOUTH EVERY 7 DAYS 10/05/19  Yes Jerene Bears, MD    Allergies  Allergen Reactions   Oatmeal     unknown   Other     ALL Tree nuts - swelling of mouth and throat, hives    Patient Active Problem List   Diagnosis Date Noted   Age-related osteoporosis without current pathological fracture 10/21/2018   Seasonal allergic rhinitis due to pollen 04/11/2017   Other nonrheumatic mitral valve disorders 01/08/2016   SVT/ PSVT/ PAT 12/28/2009   Asthma 12/27/2009   HYPERCHOLESTEROLEMIA 10/26/2009    Past Medical History:  Diagnosis Date   Allergic rhinitis    Allergy    [employer] 10/20/2020   Asthma    onset childhood; avoids trigger; [facility] consultatoin in 2014. Dulera prescribed.   Asthma    [employer] 10/20/2020   Hypercholesteremia    Hyperlipidemia    [employer] 10/20/2020   Mitral valve prolapse    Osteopenia  SVT (supraventricular tachycardia) (HCC)    presumed atrial tachycardia not inducible on EPS 12/11    Past Surgical History:  Procedure Laterality Date   EP study  12/11   by JA, no inducible arrhythmia   LUMBAR FUSION  2000   SPINE SURGERY N/A    [employer] 10/20/2020   TUBAL LIGATION  1988    Social History   Socioeconomic History   Marital status: Married    Spouse name: Not on file   Number of children: 3   Years of education: Not on file   Highest education level: Not on file  Occupational History   Occupation: Teacher, 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[employer]    Comment: case Production designer, theatre/television/film for [employer]  Tobacco Use   Smoking status: Never   Smokeless tobacco: Never  Substance and Sexual Activity   Alcohol use: Yes    Alcohol/week: 14.0 standard drinks    Types: 14 Standard drinks or  equivalent per week    Comment: wine   Drug use: No   Sexual activity: Yes    Partners: Male    Birth control/protection: Surgical, Post-menopausal    Comment: BTL  Other Topics Concern   Not on file  Social History Narrative   Marital status: married x 9 years; fourth marriage       Children: 3 daughters; 9 grandchildren; no gg      Lives: with husband      Employment: Microbiologist; 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  Walking 1.5-2.0 miles per day.   Social Determinants of Health   Financial Resource Strain: Not on file  Food Insecurity: Not on file  Transportation Needs: Not on file  Physical Activity: Not on file  Stress: Not on file  Social Connections: Not on file  Intimate Partner Violence: Not on file    Family History  Problem Relation Age of Onset   Asthma Father    Allergies Father    Asthma Paternal Grandfather    Lung cancer Mother        smoked   Hypertension Mother    Allergies Daughter    Colon cancer Neg Hx    Breast cancer Neg Hx      Review of Systems  Constitutional: Negative.  Negative for chills and fever.  HENT: Negative.  Negative for congestion and sore throat.   Respiratory: Negative.  Negative for cough and shortness of breath.   Cardiovascular: Negative.  Negative for chest pain and palpitations.  Gastrointestinal: Negative.  Negative for abdominal pain, blood in stool, diarrhea, melena, nausea and vomiting.  Genitourinary: Negative.  Negative for dysuria and hematuria.  Skin: Negative.  Negative for rash.  Neurological: Negative.  Negative for dizziness and headaches.  All other systems reviewed and are negative.  Vitals:   04/22/21 0959  BP: 138/80  Pulse: 66  Temp: 98 F (36.7 C)  SpO2: 98%   Wt Readings from Last 3 Encounters:  04/22/21 131 lb (59.4 kg)  10/22/20 134 lb 6.4 oz (61 kg)  10/18/18  138 lb (62.6 kg)    Physical Exam Vitals reviewed.  Constitutional:      Appearance: Normal appearance.  HENT:     Head: Normocephalic.  Eyes:     Extraocular Movements: Extraocular movements intact.     Conjunctiva/sclera:  Conjunctivae normal.     Pupils: Pupils are equal, round, and reactive to light.  Cardiovascular:     Rate and Rhythm: Normal rate and regular rhythm.     Pulses: Normal pulses.     Heart sounds: Normal heart sounds.  Pulmonary:     Effort: Pulmonary effort is normal.     Breath sounds: Normal breath sounds.  Musculoskeletal:        General: Normal range of motion.     Cervical back: Normal range of motion and neck supple.  Skin:    General: Skin is warm and dry.  Neurological:     General: No focal deficit present.     Mental Status: She is alert and oriented to person, place, and time.  Psychiatric:        Mood and Affect: Mood normal.        Behavior: Behavior normal.     ASSESSMENT & PLAN: A total of 30 minutes was spent with the patient and counseling/coordination of care regarding preparing for this visit, review of most recent office visit notes, review of most recent blood work results, dyslipidemia and cardiovascular risks associated with this condition, health maintenance items including mammogram which she is scheduled for tomorrow, education on nutrition, review of all medications, prognosis, documentation, and need for follow-up.  Asthma Very much under control.  Uses Dulera and albuterol only as needed. Requested refills today.  Dyslipidemia Diet and nutrition discussed. Continue rosuvastatin 5 mg daily. Fasting lipid profile done today.  Daniyah was seen today for follow-up.  Diagnoses and all orders for this visit:  Dyslipidemia -     CBC with Differential/Platelet -     Comprehensive metabolic panel -     Vitamin B12 -     Lipid panel  Age-related osteoporosis without current pathological fracture -     Vitamin B12  History  of extrinsic asthma  History of vitamin D deficiency -     VITAMIN D 25 Hydroxy (Vit-D Deficiency, Fractures)  Moderate persistent asthma without complication  Other orders -     mometasone-formoterol (DULERA) 200-5 MCG/ACT AERO; Inhale 2 puffs into the lungs 2 (two) times daily. inhale 2 puffs into the lungs first thing in the morning once as needed -     albuterol (VENTOLIN HFA) 108 (90 Base) MCG/ACT inhaler; Inhale 2 puffs into the lungs every 6 (six) hours as needed. -     Vitamin D, Ergocalciferol, (DRISDOL) 1.25 MG (50000 UNIT) CAPS capsule; Take 1 capsule (50,000 Units total) by mouth every 7 (seven) days.  Patient Instructions  Health Maintenance After Age 44 After age 57, you are at a higher risk for certain long-term diseases and infections as well as injuries from falls. Falls are a major cause of broken bones and head injuries in people who are older than age 23. Getting regular preventive care can help to keep you healthy and well. Preventive care includes getting regular testing and making lifestyle changes as recommended by your health care provider. Talk with your health care provider about: Which screenings and tests you should have. A screening is a test that checks for a disease when you have no symptoms. A diet and exercise plan that is right for you. What should I know about screenings and tests to prevent falls? Screening and testing are the best ways to find a health problem early. Early diagnosis and treatment give you the best chance of managing medical conditions that are common after age 18. Certain  conditions and lifestyle choices may make you more likely to have a fall. Your health care provider may recommend: Regular vision checks. Poor vision and conditions such as cataracts can make you more likely to have a fall. If you wear glasses, make sure to get your prescription updated if your vision changes. Medicine review. Work with your health care provider to regularly  review all of the medicines you are taking, including over-the-counter medicines. Ask your health care provider about any side effects that may make you more likely to have a fall. Tell your health care provider if any medicines that you take make you feel dizzy or sleepy. Osteoporosis screening. Osteoporosis is a condition that causes the bones to get weaker. This can make the bones weak and cause them to break more easily. Blood pressure screening. Blood pressure changes and medicines to control blood pressure can make you feel dizzy. Strength and balance checks. Your health care provider may recommend certain tests to check your strength and balance while standing, walking, or changing positions. Foot health exam. Foot pain and numbness, as well as not wearing proper footwear, can make you more likely to have a fall. Depression screening. You may be more likely to have a fall if you have a fear of falling, feel emotionally low, or feel unable to do activities that you used to do. Alcohol use screening. Using too much alcohol can affect your balance and may make you more likely to have a fall. What actions can I take to lower my risk of falls? General instructions Talk with your health care provider about your risks for falling. Tell your health care provider if: You fall. Be sure to tell your health care provider about all falls, even ones that seem minor. You feel dizzy, sleepy, or off-balance. Take over-the-counter and prescription medicines only as told by your health care provider. These include any supplements. Eat a healthy diet and maintain a healthy weight. A healthy diet includes low-fat dairy products, low-fat (lean) meats, and fiber from whole grains, beans, and lots of fruits and vegetables. Home safety Remove any tripping hazards, such as rugs, cords, and clutter. Install safety equipment such as grab bars in bathrooms and safety rails on stairs. Keep rooms and walkways  well-lit. Activity  Follow a regular exercise program to stay fit. This will help you maintain your balance. Ask your health care provider what types of exercise are appropriate for you. If you need a cane or walker, use it as recommended by your health care provider. Wear supportive shoes that have nonskid soles.  Lifestyle Do not drink alcohol if your health care provider tells you not to drink. If you drink alcohol, limit how much you have: 0-1 drink a day for women. 0-2 drinks a day for men. Be aware of how much alcohol is in your drink. In the [place] one drink equals one typical bottle of beer (12 oz), one-half glass of wine (5 oz), or one shot of hard liquor (1 oz). Do not use any products that contain nicotine or tobacco, such as cigarettes and e-cigarettes. If you need help quitting, ask your health care provider. Summary Having a healthy lifestyle and getting preventive care can help to protect your health and wellness after age 57. Screening and testing are the best way to find a health problem early and help you avoid having a fall. Early diagnosis and treatment give you the best chance for managing medical conditions that are more common for people who  are older than age 65. Falls are a major cause of broken bones and head injuries in people who are older than age 65. Take precautions to prevent a fall at home. Work with your health care provider to learn what changes you can make to improve your health and wellness and to prevent falls. This information is not intended to replace advice given to you by your health care provider. Make sure you discuss any questions you have with your healthcare provider. Document Revised: 08/24/2020 Document Reviewed: 08/24/2020 Elsevier Patient Education  2022 Elsevier Inc.     Edwina BarthMiguel Tiwan Schnitker, MD [facility]

## 2021-04-22 NOTE — Assessment & Plan Note (Signed)
Very much under control.  Uses Dulera and albuterol only as needed. Requested refills today.

## 2021-04-23 ENCOUNTER — Ambulatory Visit
Admission: RE | Admit: 2021-04-23 | Discharge: 2021-04-23 | Disposition: A | Discharge status: Home / Self Care | Payer: Medicare Other | Source: Ambulatory Visit | Attending: Emergency Medicine | Admitting: Emergency Medicine

## 2021-04-23 DIAGNOSIS — Z1231 Encounter for screening mammogram for malignant neoplasm of breast: Secondary | ICD-10-CM | POA: Diagnosis not present

## 2021-06-24 ENCOUNTER — Ambulatory Visit (INDEPENDENT_AMBULATORY_CARE_PROVIDER_SITE_OTHER): Payer: Medicare Other | Admitting: Emergency Medicine

## 2021-06-24 ENCOUNTER — Encounter: Payer: Self-pay | Admitting: Emergency Medicine

## 2021-06-24 ENCOUNTER — Other Ambulatory Visit: Payer: Self-pay

## 2021-06-24 VITALS — BP 120/80 | HR 77 | Ht 67.0 in | Wt 132.0 lb

## 2021-06-24 DIAGNOSIS — R300 Dysuria: Secondary | ICD-10-CM

## 2021-06-24 DIAGNOSIS — R3915 Urgency of urination: Secondary | ICD-10-CM | POA: Diagnosis not present

## 2021-06-24 DIAGNOSIS — B999 Unspecified infectious disease: Secondary | ICD-10-CM

## 2021-06-24 LAB — POCT URINALYSIS DIP (MANUAL ENTRY)
Bilirubin, UA: NEGATIVE
Blood, UA: NEGATIVE
Glucose, UA: 100 mg/dL — AB
Ketones, POC UA: NEGATIVE mg/dL
Leukocytes, UA: NEGATIVE
Nitrite, UA: NEGATIVE
Spec Grav, UA: 1.01 (ref 1.010–1.025)
Urobilinogen, UA: 0.2 E.U./dL
pH, UA: 8 (ref 5.0–8.0)

## 2021-06-24 MED ORDER — SULFAMETHOXAZOLE-TRIMETHOPRIM 800-160 MG PO TABS: 1.0000 | ORAL_TABLET | Freq: Two times a day (BID) | ORAL | 0 refills | Status: DC

## 2021-06-24 NOTE — Progress Notes (Signed)
Teresa Fitzgerald 65 y.o.   Chief Complaint  Patient presents with   Dysuria    HISTORY OF PRESENT ILLNESS: This is a 65 y.o. female complaining of burning on urination and frequency since last week. Denies fever, chills, nausea or vomiting, or flank pain.  Denies gross hematuria.  Dysuria  Associated symptoms include frequency and urgency. Pertinent negatives include no chills, flank pain, hematuria, nausea or vomiting.    Prior to Admission medications   Medication Sig Start Date End Date Taking? Authorizing Provider  albuterol (VENTOLIN HFA) 108 (90 Base) MCG/ACT inhaler Inhale 2 puffs into the lungs every 6 (six) hours as needed. 04/22/21  Yes SagardiaEilleen Kempf, MD  aspirin 81 MG tablet Take 81 mg by mouth daily.   Yes [provider]  calcium carbonate (OSCAL) 1500 (600 Ca) MG TABS tablet Take 1 tablet by mouth 2 (two) times daily.   Yes [provider]  mometasone-formoterol (DULERA) 200-5 MCG/ACT AERO Inhale 2 puffs into the lungs 2 (two) times daily. inhale 2 puffs into the lungs first thing in the morning once as needed 04/22/21  Yes Kohei Antonellis, Eilleen Kempf, MD  rosuvastatin (CRESTOR) 5 MG tablet Take 1 tablet (5 mg total) by mouth daily. 10/23/20  Yes Doniqua Saxby, Eilleen Kempf, MD  vitamin B-12 (CYANOCOBALAMIN) 1000 MCG tablet Take 1,000 mcg by mouth daily.   Yes [provider]  Vitamin D, Ergocalciferol, (DRISDOL) 1.25 MG (50000 UNIT) CAPS capsule Take 1 capsule (50,000 Units total) by mouth every 7 (seven) days. 04/22/21  Yes Georgina Quint, MD    Allergies  Allergen Reactions   Oatmeal     unknown   Other     ALL Tree nuts - swelling of mouth and throat, hives    Patient Active Problem List   Diagnosis Date Noted   Dyslipidemia 04/22/2021   Age-related osteoporosis without current pathological fracture 10/21/2018   Seasonal allergic rhinitis due to pollen 04/11/2017   Other nonrheumatic mitral valve disorders 01/08/2016   SVT/ PSVT/ PAT  12/28/2009   Asthma 12/27/2009   HYPERCHOLESTEROLEMIA 10/26/2009    Past Medical History:  Diagnosis Date   Allergic rhinitis    Allergy    [employer] 10/20/2020   Asthma    onset childhood; avoids trigger; [facility] consultatoin in 2014. Dulera prescribed.   Asthma    [employer] 10/20/2020   Hypercholesteremia    Hyperlipidemia    [employer] 10/20/2020   Mitral valve prolapse    Osteopenia    SVT (supraventricular tachycardia) (HCC)    presumed atrial tachycardia not inducible on EPS 12/11    Past Surgical History:  Procedure Laterality Date   EP study  12/11   by JA, no inducible arrhythmia   LUMBAR FUSION  2000   SPINE SURGERY N/A    [employer] 10/20/2020   TUBAL LIGATION  1988    Social History   Socioeconomic History   Marital status: Married    Spouse name: Not on file   Number of children: 3   Years of education: Not on file   Highest education level: Not on file  Occupational History   Occupation: Teacher, 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[employer]    Comment: case Production designer, theatre/television/film for [employer]  Tobacco Use   Smoking status: Never   Smokeless tobacco: Never  Substance and Sexual Activity   Alcohol use: Yes    Alcohol/week: 14.0 standard drinks    Types: 14 Standard drinks or equivalent per week    Comment:  wine   Drug use: No   Sexual activity: Yes    Partners: Male    Birth control/protection: Surgical, Post-menopausal    Comment: BTL  Other Topics Concern   Not on file  Social History Narrative   Marital status: married x 9 years; fourth marriage       Children: 3 daughters; 9 grandchildren; no gg      Lives: with husband      Employment: Microbiologist; 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  Walking 1.5-2.0 miles per day.   Social Determinants of Health   Financial Resource Strain: Not on file  Food Insecurity: Not on file  Transportation  Needs: Not on file  Physical Activity: Not on file  Stress: Not on file  Social Connections: Not on file  Intimate Partner Violence: Not on file    Family History  Problem Relation Age of Onset   Asthma Father    Allergies Father    Asthma Paternal Grandfather    Lung cancer Mother        smoked   Hypertension Mother    Allergies Daughter    Colon cancer Neg Hx    Breast cancer Neg Hx      Review of Systems  Constitutional: Negative.  Negative for chills and fever.  HENT: Negative.  Negative for congestion and sore throat.   Respiratory: Negative.  Negative for cough and shortness of breath.   Cardiovascular: Negative.  Negative for chest pain and palpitations.  Gastrointestinal:  Negative for abdominal pain, diarrhea, nausea and vomiting.  Genitourinary:  Positive for dysuria, frequency and urgency. Negative for flank pain and hematuria.  Musculoskeletal:  Positive for back pain.  Skin: Negative.  Negative for rash.  Neurological: Negative.  Negative for dizziness and headaches.  All other systems reviewed and are negative.  Today's Vitals   06/24/21 1446  BP: 120/80  Pulse: 77  SpO2: 97%  Weight: 132 lb (59.9 kg)  Height: 5\' 7"  (1.702 m)   Body mass index is 20.67 kg/m.  Physical Exam Vitals reviewed.  Constitutional:      Appearance: Normal appearance.  HENT:     Head: Normocephalic.  Eyes:     Extraocular Movements: Extraocular movements intact.     Pupils: Pupils are equal, round, and reactive to light.  Cardiovascular:     Rate and Rhythm: Normal rate.  Pulmonary:     Effort: Pulmonary effort is normal.  Abdominal:     Palpations: Abdomen is soft.     Tenderness: There is no abdominal tenderness. There is no right CVA tenderness or left CVA tenderness.  Musculoskeletal:        General: Normal range of motion.     Cervical back: Normal range of motion.  Skin:    General: Skin is warm and dry.  Neurological:     General: No focal deficit present.      Mental Status: She is alert and oriented to person, place, and time.  Psychiatric:        Mood and Affect: Mood normal.        Behavior: Behavior normal.    Results for orders placed or performed in visit on 06/24/21 (from the past 24 hour(s))  POCT urinalysis dipstick     Status: Abnormal   Collection Time: 06/24/21  2:48 PM  Result Value Ref Range   Color, UA yellow yellow   Clarity, UA clear clear   Glucose, UA =100 (A) negative mg/dL   Bilirubin, UA negative negative   Ketones, POC UA negative negative mg/dL   Spec Grav, UA 3.154 0.086 - 1.025   Blood, UA negative negative   pH, UA 8.0 5.0 - 8.0   Protein Ur, POC trace (A) negative mg/dL   Urobilinogen, UA 0.2 0.2 or 1.0 E.U./dL   Nitrite, UA Negative Negative   Leukocytes, UA Negative Negative    ASSESSMENT & PLAN: Problem List Items Addressed This Visit   None Visit Diagnoses     Dysuria    -  Primary   Relevant Medications   sulfamethoxazole-trimethoprim (BACTRIM DS) 800-160 MG tablet   Other Relevant Orders   POCT urinalysis dipstick (Completed)   Urine Culture   Clinical infection       Relevant Medications   sulfamethoxazole-trimethoprim (BACTRIM DS) 800-160 MG tablet   Urinary urgency          Patient Instructions  Urinary Tract Infection, Adult A urinary tract infection (UTI) is an infection of any part of the urinary tract. The urinary tract includes: The kidneys. The ureters. The bladder. The urethra. These organs make, store, and get rid of pee (urine) in the body. What are the causes? This infection is caused by germs (bacteria) in your genital area. These germs grow and cause swelling (inflammation) of your urinary tract. What increases the risk? The following factors may make you more likely to develop this condition: Using a small, thin tube (catheter) to drain pee. Not being able to control when you pee or poop (incontinence). Being female. If you are female, these things can increase  the risk: Using these methods to prevent pregnancy: A medicine that kills sperm (spermicide). A device that blocks sperm (diaphragm). Having low levels of a female hormone (estrogen). Being pregnant. You are more likely to develop this condition if: You have genes that add to your risk. You are sexually active. You take antibiotic medicines. You have trouble peeing because of: A prostate that is bigger than normal, if you are female. A blockage in the part of your body that drains pee from the bladder. A kidney stone. A nerve condition that affects your bladder. Not getting enough to drink. Not peeing often enough. You have other conditions, such as: Diabetes. A weak disease-fighting system (immune system). Sickle cell disease. Gout. Injury of the spine. What are the signs or symptoms? Symptoms of this condition include: Needing to pee right away. Peeing small amounts often. Pain or burning when peeing. Blood in the pee. Pee that smells bad or not like normal. Trouble peeing. Pee that is cloudy. Fluid coming from the vagina, if you are female. Pain in the belly or lower back. Other symptoms include: Vomiting. Not feeling hungry. Feeling mixed up (confused). This may be the first symptom in older adults. Being tired and grouchy (irritable). A fever. Watery poop (diarrhea). How is this treated? Taking antibiotic medicine. Taking other medicines. Drinking enough water. In some cases, you may need to see a specialist. Follow these instructions at home: Medicines Take over-the-counter and prescription medicines only as told by your doctor. If you were prescribed an antibiotic medicine, take it as told by your doctor. Do not stop taking it even if you start to feel better. General instructions Make sure you: Pee until your bladder is empty. Do not hold pee for a  long time. Empty your bladder after sex. Wipe from front to back after peeing or pooping if you are a female.  Use each tissue one time when you wipe. Drink enough fluid to keep your pee pale yellow. Keep all follow-up visits. Contact a doctor if: You do not get better after 1-2 days. Your symptoms go away and then come back. Get help right away if: You have very bad back pain. You have very bad pain in your lower belly. You have a fever. You have chills. You feeling like you will vomit or you vomit. Summary A urinary tract infection (UTI) is an infection of any part of the urinary tract. This condition is caused by germs in your genital area. There are many risk factors for a UTI. Treatment includes antibiotic medicines. Drink enough fluid to keep your pee pale yellow. This information is not intended to replace advice given to you by your health care provider. Make sure you discuss any questions you have with your health care provider. Document Revised: 04/20/2020 Document Reviewed: 04/20/2020 Elsevier Patient Education  2022 Elsevier Inc.    Edwina Barth, MD [facility]

## 2021-06-24 NOTE — Patient Instructions (Signed)

## 2021-06-26 LAB — URINE CULTURE

## 2021-06-30 ENCOUNTER — Encounter: Payer: Self-pay | Admitting: Emergency Medicine

## 2021-07-03 ENCOUNTER — Other Ambulatory Visit: Payer: Self-pay | Admitting: Emergency Medicine

## 2021-07-03 MED ORDER — CEFUROXIME AXETIL 500 MG PO TABS: 500.0000 mg | ORAL_TABLET | Freq: Two times a day (BID) | ORAL | 0 refills | Status: AC

## 2021-07-03 NOTE — Telephone Encounter (Signed)
Patient requesting response to MyChart message regarding UTI symptoms

## 2021-07-03 NOTE — Telephone Encounter (Signed)
Needs to start new antibiotic.  Prescription for Ceftin 500 mg twice a day for 7 days sent to pharmacy of record.  Thanks.

## 2021-10-23 ENCOUNTER — Other Ambulatory Visit: Payer: Self-pay | Admitting: Emergency Medicine

## 2021-10-23 DIAGNOSIS — E7800 Pure hypercholesterolemia, unspecified: Secondary | ICD-10-CM

## 2021-11-02 ENCOUNTER — Telehealth: Payer: Medicare Other | Admitting: Nurse Practitioner

## 2021-11-02 DIAGNOSIS — N3000 Acute cystitis without hematuria: Secondary | ICD-10-CM

## 2021-11-02 MED ORDER — CEPHALEXIN 500 MG PO CAPS: 500.0000 mg | ORAL_CAPSULE | Freq: Two times a day (BID) | ORAL | 0 refills | Status: AC

## 2021-11-02 NOTE — Progress Notes (Signed)
Virtual Visit Consent   Teresa Fitzgerald, you are scheduled for a virtual visit with a [facility] provider today.     Just as with appointments in the office, your consent must be obtained to participate.  Your consent will be active for this visit and any virtual visit you may have with one of our providers in the next 365 days.     If you have a MyChart account, a copy of this consent can be sent to you electronically.  All virtual visits are billed to your insurance company just like a traditional visit in the office.    As this is a virtual visit, video technology does not allow for your provider to perform a traditional examination.  This may limit your provider's ability to fully assess your condition.  If your provider identifies any concerns that need to be evaluated in person or the need to arrange testing (such as labs, EKG, etc.), we will make arrangements to do so.     Although advances in technology are sophisticated, we cannot ensure that it will always work on either your end or our end.  If the connection with a video visit is poor, the visit may have to be switched to a telephone visit.  With either a video or telephone visit, we are not always able to ensure that we have a secure connection.     I need to obtain your verbal consent now.   Are you willing to proceed with your visit today?    Teresa Fitzgerald has provided verbal consent on 11/02/2021 for a virtual visit (video or telephone).   Apolonio Schneiders, FNP   Date: 11/02/2021 11:27 AM   Virtual Visit via Video Note   I, Apolonio Schneiders, connected with  Teresa Fitzgerald  ([id number], 1955/11/28) on 11/02/21 at 11:45 AM EST by a video-enabled telemedicine application and verified that I am speaking with the correct person using two identifiers.  Location: Patient: Virtual Visit Location Patient: Home Provider: Virtual Visit Location Provider: Home Office   I discussed the limitations of evaluation and management by telemedicine and the  availability of in person appointments. The patient expressed understanding and agreed to proceed.    History of Present Illness: Teresa Fitzgerald is a 66 y.o. who identifies as a female who was assigned female at birth, and is being seen today with complaints of symptoms consistent with UTI. She has been trying to flush out her system for the past 3 days without relief.   She most recently had one in October of last year. Did not respond well to Bactrim on her most recent infection though culture sensitivity was present.   Today her symptoms include urgency, foul smelling urine. No blood.   Denies fever back pain or N/V   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   Asthma 12/27/2009   HYPERCHOLESTEROLEMIA 10/26/2009    Allergies:  Allergies  Allergen Reactions   Oatmeal     unknown   Other     ALL Tree nuts - swelling of mouth and throat, hives   Medications:  Current Outpatient Medications:    albuterol (VENTOLIN HFA) 108 (90 Base) MCG/ACT inhaler, Inhale 2 puffs into the lungs every 6 (six) hours as needed.,  Disp: 1 each, Rfl: 1   aspirin 81 MG tablet, Take 81 mg by mouth daily., Disp: , Rfl:    calcium carbonate (OSCAL) 1500 (600 Ca) MG TABS tablet, Take 1 tablet by mouth 2 (two) times daily., Disp: , Rfl:    mometasone-formoterol (DULERA) 200-5 MCG/ACT AERO, Inhale 2 puffs into the lungs 2 (two) times daily. inhale 2 puffs into the lungs first thing in the morning once as needed, Disp: 13 g, Rfl: 5   rosuvastatin (CRESTOR) 5 MG tablet, TAKE 1 TABLET (5 MG TOTAL) BY MOUTH DAILY., Disp: 90 tablet, Rfl: 3   vitamin B-12 (CYANOCOBALAMIN) 1000 MCG tablet, Take 1,000 mcg by mouth daily., Disp: , Rfl:    Vitamin D,  Ergocalciferol, (DRISDOL) 1.25 MG (50000 UNIT) CAPS capsule, Take 1 capsule (50,000 Units total) by mouth every 7 (seven) days., Disp: 12 capsule, Rfl: 3  Observations/Objective: Patient is well-developed, well-nourished in no acute distress.  Resting comfortably at home.  Head is normocephalic, atraumatic.  No labored breathing.  Speech is clear and coherent with logical content.  Patient is alert and oriented at baseline.    Assessment and Plan: 1. Acute cystitis without hematuria Meds ordered this encounter  Medications   cephALEXin (KEFLEX) 500 MG capsule    Sig: Take 1 capsule (500 mg total) by mouth 2 (two) times daily for 7 days.    Dispense:  14 capsule    Refill:  0        Follow Up Instructions: I discussed the assessment and treatment plan with the patient. The patient was provided an opportunity to ask questions and all were answered. The patient agreed with the plan and demonstrated an understanding of the instructions.  A copy of instructions were sent to the patient via MyChart unless otherwise noted below.    The patient was advised to call back or seek an in-person evaluation if the symptoms worsen or if the condition fails to improve as anticipated.  Time:  I spent 10 minutes with the patient via telehealth technology discussing the above problems/concerns.    Apolonio Schneiders, FNP

## 2022-02-18 ENCOUNTER — Ambulatory Visit (HOSPITAL_BASED_OUTPATIENT_CLINIC_OR_DEPARTMENT_OTHER): Payer: Medicare Other | Admitting: Obstetrics & Gynecology

## 2022-02-25 ENCOUNTER — Telehealth: Payer: Medicare Other | Admitting: Physician Assistant

## 2022-02-25 DIAGNOSIS — R3989 Other symptoms and signs involving the genitourinary system: Secondary | ICD-10-CM

## 2022-02-25 MED ORDER — CEPHALEXIN 500 MG PO CAPS: 500.0000 mg | ORAL_CAPSULE | Freq: Two times a day (BID) | ORAL | 0 refills | Status: DC

## 2022-02-25 NOTE — Patient Instructions (Signed)
Teresa Fitzgerald, thank you for joining Mar Daring, PA-C for today's virtual visit.  While this provider is not your primary care provider (PCP), if your PCP is located in our provider database this encounter information will be shared with them immediately following your visit.  Consent: (Patient) Teresa Fitzgerald provided verbal consent for this virtual visit at the beginning of the encounter.  Current Medications:  Current Outpatient Medications:    cephALEXin (KEFLEX) 500 MG capsule, Take 1 capsule (500 mg total) by mouth 2 (two) times daily., Disp: 14 capsule, Rfl: 0   albuterol (VENTOLIN HFA) 108 (90 Base) MCG/ACT inhaler, Inhale 2 puffs into the lungs every 6 (six) hours as needed., Disp: 1 each, Rfl: 1   aspirin 81 MG tablet, Take 81 mg by mouth daily., Disp: , Rfl:    calcium carbonate (OSCAL) 1500 (600 Ca) MG TABS tablet, Take 1 tablet by mouth 2 (two) times daily., Disp: , Rfl:    mometasone-formoterol (DULERA) 200-5 MCG/ACT AERO, Inhale 2 puffs into the lungs 2 (two) times daily. inhale 2 puffs into the lungs first thing in the morning once as needed, Disp: 13 g, Rfl: 5   rosuvastatin (CRESTOR) 5 MG tablet, TAKE 1 TABLET (5 MG TOTAL) BY MOUTH DAILY., Disp: 90 tablet, Rfl: 3   vitamin B-12 (CYANOCOBALAMIN) 1000 MCG tablet, Take 1,000 mcg by mouth daily., Disp: , Rfl:    Vitamin D, Ergocalciferol, (DRISDOL) 1.25 MG (50000 UNIT) CAPS capsule, Take 1 capsule (50,000 Units total) by mouth every 7 (seven) days., Disp: 12 capsule, Rfl: 3   Medications ordered in this encounter:  Meds ordered this encounter  Medications   cephALEXin (KEFLEX) 500 MG capsule    Sig: Take 1 capsule (500 mg total) by mouth 2 (two) times daily.    Dispense:  14 capsule    Refill:  0    Order Specific Question:   Supervising Provider    Answer:   Sabra Heck, [place]     *If you need refills on other medications prior to your next appointment, please contact your pharmacy*  Follow-Up: Call back or  seek an in-person evaluation if the symptoms worsen or if the condition fails to improve as anticipated.  Other Instructions Urinary Tract Infection, Adult  A urinary tract infection (UTI) is an infection of any part of the urinary tract. The urinary tract includes the kidneys, ureters, bladder, and urethra. These organs make, store, and get rid of urine in the body. An upper UTI affects the ureters and kidneys. A lower UTI affects the bladder and urethra. What are the causes? Most urinary tract infections are caused by bacteria in your genital area around your urethra, where urine leaves your body. These bacteria grow and cause inflammation of your urinary tract. What increases the risk? You are more likely to develop this condition if: You have a urinary catheter that stays in place. You are not able to control when you urinate or have a bowel movement (incontinence). You are female and you: Use a spermicide or diaphragm for birth control. Have low estrogen levels. Are pregnant. You have certain genes that increase your risk. You are sexually active. You take antibiotic medicines. You have a condition that causes your flow of urine to slow down, such as: An enlarged prostate, if you are female. Blockage in your urethra. A kidney stone. A nerve condition that affects your bladder control (neurogenic bladder). Not getting enough to drink, or not urinating often. You have certain medical  conditions, such as: Diabetes. A weak disease-fighting system (immunesystem). Sickle cell disease. Gout. Spinal cord injury. What are the signs or symptoms? Symptoms of this condition include: Needing to urinate right away (urgency). Frequent urination. This may include small amounts of urine each time you urinate. Pain or burning with urination. Blood in the urine. Urine that smells bad or unusual. Trouble urinating. Cloudy urine. Vaginal discharge, if you are female. Pain in the abdomen or  the lower back. You may also have: Vomiting or a decreased appetite. Confusion. Irritability or tiredness. A fever or chills. Diarrhea. The first symptom in older adults may be confusion. In some cases, they may not have any symptoms until the infection has worsened. How is this diagnosed? This condition is diagnosed based on your medical history and a physical exam. You may also have other tests, including: Urine tests. Blood tests. Tests for STIs (sexually transmitted infections). If you have had more than one UTI, a cystoscopy or imaging studies may be done to determine the cause of the infections. How is this treated? Treatment for this condition includes: Antibiotic medicine. Over-the-counter medicines to treat discomfort. Drinking enough water to stay hydrated. If you have frequent infections or have other conditions such as a kidney stone, you may need to see a health care provider who specializes in the urinary tract (urologist). In rare cases, urinary tract infections can cause sepsis. Sepsis is a life-threatening condition that occurs when the body responds to an infection. Sepsis is treated in the hospital with IV antibiotics, fluids, and other medicines. Follow these instructions at home:  Medicines Take over-the-counter and prescription medicines only as told by your health care provider. If you were prescribed an antibiotic medicine, take it as told by your health care provider. Do not stop using the antibiotic even if you start to feel better. General instructions Make sure you: Empty your bladder often and completely. Do not hold urine for long periods of time. Empty your bladder after sex. Wipe from front to back after urinating or having a bowel movement if you are female. Use each tissue only one time when you wipe. Drink enough fluid to keep your urine pale yellow. Keep all follow-up visits. This is important. Contact a health care provider if: Your symptoms do  not get better after 1-2 days. Your symptoms go away and then return. Get help right away if: You have severe pain in your back or your lower abdomen. You have a fever or chills. You have nausea or vomiting. Summary A urinary tract infection (UTI) is an infection of any part of the urinary tract, which includes the kidneys, ureters, bladder, and urethra. Most urinary tract infections are caused by bacteria in your genital area. Treatment for this condition often includes antibiotic medicines. If you were prescribed an antibiotic medicine, take it as told by your health care provider. Do not stop using the antibiotic even if you start to feel better. Keep all follow-up visits. This is important. This information is not intended to replace advice given to you by your health care provider. Make sure you discuss any questions you have with your health care provider. Document Revised: 04/20/2020 Document Reviewed: 04/20/2020 Elsevier Patient Education  2023 Elsevier Inc.    If you have been instructed to have an in-person evaluation today at a local Urgent Care facility, please use the link below. It will take you to a list of all of our available [facility], including address, phone number and hours  of operation. Please do not delay care.  [facility]  If you or a family member do not have a primary care provider, use the link below to schedule a visit and establish care. When you choose a [facility] primary care physician or advanced practice provider, you gain a long-term partner in health. Find a Primary Care Provider  Learn more about [facility] in-office and virtual care options: [facility]

## 2022-02-25 NOTE — Progress Notes (Signed)
Virtual Visit Consent   Teresa Fitzgerald, you are scheduled for a virtual visit with a [facility] provider today. Just as with appointments in the office, your consent must be obtained to participate. Your consent will be active for this visit and any virtual visit you may have with one of our providers in the next 365 days. If you have a MyChart account, a copy of this consent can be sent to you electronically.  As this is a virtual visit, video technology does not allow for your provider to perform a traditional examination. This may limit your provider's ability to fully assess your condition. If your provider identifies any concerns that need to be evaluated in person or the need to arrange testing (such as labs, EKG, etc.), we will make arrangements to do so. Although advances in technology are sophisticated, we cannot ensure that it will always work on either your end or our end. If the connection with a video visit is poor, the visit may have to be switched to a telephone visit. With either a video or telephone visit, we are not always able to ensure that we have a secure connection.  By engaging in this virtual visit, you consent to the provision of healthcare and authorize for your insurance to be billed (if applicable) for the services provided during this visit. Depending on your insurance coverage, you may receive a charge related to this service.  I need to obtain your verbal consent now. Are you willing to proceed with your visit today? Teresa Fitzgerald has provided verbal consent on 02/25/2022 for a virtual visit (video or telephone). Margaretann Loveless, PA-C  Date: 02/25/2022 9:18 AM  Virtual Visit via Video Note   I, Margaretann Loveless, connected with  Teresa Fitzgerald  ([id number], [birth year]) on 02/25/22 at  9:30 AM EDT by a video-enabled telemedicine application and verified that I am speaking with the correct person using two identifiers.  Location: Patient: Virtual Visit Location Patient:  Home Provider: Virtual Visit Location Provider: Home Office   I discussed the limitations of evaluation and management by telemedicine and the availability of in person appointments. The patient expressed understanding and agreed to proceed.    History of Present Illness: Teresa Fitzgerald is a 66 y.o. who identifies as a female who was assigned female at birth, and is being seen today for possible UTI.  HPI: Urinary Tract Infection  This is a new problem. The current episode started in the past 7 days (Saturday). The problem occurs every urination. The problem has been gradually worsening. The quality of the pain is described as aching and burning. The pain is mild. There has been no fever. Associated symptoms include frequency, hematuria, hesitancy, nausea (just today) and urgency. Pertinent negatives include no chills, discharge, flank pain or vomiting. She has tried increased fluids (pyridium) for the symptoms. The treatment provided no relief. Her past medical history is significant for recurrent UTIs.     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   Asthma 12/27/2009   HYPERCHOLESTEROLEMIA 10/26/2009    Allergies:  Allergies  Allergen Reactions   Oatmeal     unknown   Other     ALL Tree nuts - swelling of mouth and throat, hives  Medications:  Current Outpatient Medications:    cephALEXin (KEFLEX) 500 MG capsule, Take 1 capsule (500 mg total) by mouth 2 (two) times daily., Disp: 14 capsule, Rfl: 0   albuterol (VENTOLIN HFA) 108 (90 Base) MCG/ACT inhaler, Inhale 2 puffs into the lungs every 6 (six) hours as needed., Disp: 1 each, Rfl: 1   aspirin 81 MG tablet, Take 81 mg by mouth daily., Disp: , Rfl:    calcium carbonate  (OSCAL) 1500 (600 Ca) MG TABS tablet, Take 1 tablet by mouth 2 (two) times daily., Disp: , Rfl:    mometasone-formoterol (DULERA) 200-5 MCG/ACT AERO, Inhale 2 puffs into the lungs 2 (two) times daily. inhale 2 puffs into the lungs first thing in the morning once as needed, Disp: 13 g, Rfl: 5   rosuvastatin (CRESTOR) 5 MG tablet, TAKE 1 TABLET (5 MG TOTAL) BY MOUTH DAILY., Disp: 90 tablet, Rfl: 3   vitamin B-12 (CYANOCOBALAMIN) 1000 MCG tablet, Take 1,000 mcg by mouth daily., Disp: , Rfl:    Vitamin D, Ergocalciferol, (DRISDOL) 1.25 MG (50000 UNIT) CAPS capsule, Take 1 capsule (50,000 Units total) by mouth every 7 (seven) days., Disp: 12 capsule, Rfl: 3  Observations/Objective: Patient is well-developed, well-nourished in no acute distress.  Resting comfortably at home.  Head is normocephalic, atraumatic.  No labored breathing.  Speech is clear and coherent with logical content.  Patient is alert and oriented at baseline.    Assessment and Plan: 1. Suspected UTI - cephALEXin (KEFLEX) 500 MG capsule; Take 1 capsule (500 mg total) by mouth 2 (two) times daily.  Dispense: 14 capsule; Refill: 0  - Worsening symptoms.  - Will treat empirically with Keflex - May use AZO for bladder spasms - Continue to push fluids.  - Seek in person evaluation for urine culture if symptoms do not improve or if they worsen.    Follow Up Instructions: I discussed the assessment and treatment plan with the patient. The patient was provided an opportunity to ask questions and all were answered. The patient agreed with the plan and demonstrated an understanding of the instructions.  A copy of instructions were sent to the patient via MyChart unless otherwise noted below.    The patient was advised to call back or seek an in-person evaluation if the symptoms worsen or if the condition fails to improve as anticipated.  Time:  I spent 8 minutes with the patient via telehealth technology discussing the above  problems/concerns.    Margaretann Loveless, PA-C

## 2022-03-18 ENCOUNTER — Other Ambulatory Visit (HOSPITAL_COMMUNITY)
Admission: RE | Admit: 2022-03-18 | Discharge: 2022-03-18 | Disposition: A | Discharge status: Home / Self Care | Payer: Medicare Other | Source: Ambulatory Visit | Attending: Obstetrics & Gynecology | Admitting: Obstetrics & Gynecology

## 2022-03-18 ENCOUNTER — Ambulatory Visit (INDEPENDENT_AMBULATORY_CARE_PROVIDER_SITE_OTHER): Payer: Medicare Other | Admitting: Obstetrics & Gynecology

## 2022-03-18 ENCOUNTER — Encounter (HOSPITAL_BASED_OUTPATIENT_CLINIC_OR_DEPARTMENT_OTHER): Payer: Self-pay | Admitting: Obstetrics & Gynecology

## 2022-03-18 VITALS — BP 124/72 | HR 72 | Ht 67.0 in | Wt 135.0 lb

## 2022-03-18 DIAGNOSIS — Z87440 Personal history of urinary (tract) infections: Secondary | ICD-10-CM

## 2022-03-18 DIAGNOSIS — Z124 Encounter for screening for malignant neoplasm of cervix: Secondary | ICD-10-CM

## 2022-03-18 DIAGNOSIS — Z1151 Encounter for screening for human papillomavirus (HPV): Secondary | ICD-10-CM | POA: Diagnosis not present

## 2022-03-18 DIAGNOSIS — Z01411 Encounter for gynecological examination (general) (routine) with abnormal findings: Secondary | ICD-10-CM | POA: Diagnosis present

## 2022-03-18 DIAGNOSIS — Z8619 Personal history of other infectious and parasitic diseases: Secondary | ICD-10-CM

## 2022-03-18 DIAGNOSIS — B977 Papillomavirus as the cause of diseases classified elsewhere: Secondary | ICD-10-CM | POA: Diagnosis present

## 2022-03-18 MED ORDER — ESTRADIOL 0.1 MG/GM VA CREA: TOPICAL_CREAM | VAGINAL | 6 refills | Status: DC

## 2022-03-19 ENCOUNTER — Other Ambulatory Visit: Payer: Self-pay | Admitting: Emergency Medicine

## 2022-03-20 LAB — CYTOLOGY - PAP
Comment: NEGATIVE
Diagnosis: NEGATIVE
High risk HPV: NEGATIVE

## 2022-03-24 DIAGNOSIS — H2513 Age-related nuclear cataract, bilateral: Secondary | ICD-10-CM | POA: Diagnosis not present

## 2022-03-24 DIAGNOSIS — H5212 Myopia, left eye: Secondary | ICD-10-CM | POA: Diagnosis not present

## 2022-03-24 DIAGNOSIS — H524 Presbyopia: Secondary | ICD-10-CM | POA: Diagnosis not present

## 2022-03-24 DIAGNOSIS — H5201 Hypermetropia, right eye: Secondary | ICD-10-CM | POA: Diagnosis not present

## 2022-03-24 DIAGNOSIS — H52203 Unspecified astigmatism, bilateral: Secondary | ICD-10-CM | POA: Diagnosis not present

## 2022-04-15 ENCOUNTER — Ambulatory Visit (INDEPENDENT_AMBULATORY_CARE_PROVIDER_SITE_OTHER): Payer: Medicare Other

## 2022-04-15 ENCOUNTER — Encounter (HOSPITAL_BASED_OUTPATIENT_CLINIC_OR_DEPARTMENT_OTHER): Payer: Self-pay | Admitting: Obstetrics & Gynecology

## 2022-04-15 DIAGNOSIS — Z87440 Personal history of urinary (tract) infections: Secondary | ICD-10-CM

## 2022-04-15 LAB — POCT URINALYSIS DIPSTICK
Bilirubin, UA: NEGATIVE
Blood, UA: NEGATIVE
Glucose, UA: NEGATIVE
Ketones, UA: NEGATIVE
Nitrite, UA: NEGATIVE
Protein, UA: NEGATIVE
Spec Grav, UA: 1.015 (ref 1.010–1.025)
Urobilinogen, UA: 0.2 E.U./dL
pH, UA: 7.5 (ref 5.0–8.0)

## 2022-04-15 NOTE — Progress Notes (Signed)
Patient came in today to give a urine sample. Patient has complaints of urinary frequency and burning. Urine sample sent off for culture. tbw

## 2022-04-16 DIAGNOSIS — Z87440 Personal history of urinary (tract) infections: Secondary | ICD-10-CM | POA: Diagnosis not present

## 2022-04-19 LAB — URINE CULTURE

## 2022-04-19 MED ORDER — NITROFURANTOIN MONOHYD MACRO 100 MG PO CAPS: 100.0000 mg | ORAL_CAPSULE | Freq: Two times a day (BID) | ORAL | 0 refills | Status: DC

## 2022-04-19 NOTE — Addendum Note (Signed)
Addended by: Jerene Bears on: 04/19/2022 07:26 PM   Modules accepted: Orders

## 2022-05-04 ENCOUNTER — Encounter (HOSPITAL_BASED_OUTPATIENT_CLINIC_OR_DEPARTMENT_OTHER): Payer: Self-pay | Admitting: Obstetrics & Gynecology

## 2022-05-05 ENCOUNTER — Other Ambulatory Visit (HOSPITAL_BASED_OUTPATIENT_CLINIC_OR_DEPARTMENT_OTHER): Payer: Self-pay | Admitting: Obstetrics & Gynecology

## 2022-05-05 ENCOUNTER — Telehealth (HOSPITAL_BASED_OUTPATIENT_CLINIC_OR_DEPARTMENT_OTHER): Payer: Self-pay

## 2022-05-05 ENCOUNTER — Ambulatory Visit (INDEPENDENT_AMBULATORY_CARE_PROVIDER_SITE_OTHER): Payer: Medicare Other

## 2022-05-05 DIAGNOSIS — Z87440 Personal history of urinary (tract) infections: Secondary | ICD-10-CM | POA: Diagnosis not present

## 2022-05-05 DIAGNOSIS — R300 Dysuria: Secondary | ICD-10-CM

## 2022-05-05 LAB — POCT URINALYSIS DIPSTICK
Bilirubin, UA: NEGATIVE
Glucose, UA: NEGATIVE
Ketones, UA: NEGATIVE
Nitrite, UA: NEGATIVE
Protein, UA: NEGATIVE
Spec Grav, UA: 1.025 (ref 1.010–1.025)
Urobilinogen, UA: 0.2 E.U./dL
pH, UA: 6.5 (ref 5.0–8.0)

## 2022-05-05 MED ORDER — SULFAMETHOXAZOLE-TRIMETHOPRIM 800-160 MG PO TABS: 1.0000 | ORAL_TABLET | Freq: Two times a day (BID) | ORAL | 0 refills | Status: DC

## 2022-05-05 NOTE — Progress Notes (Signed)
Patient came in today with complaints of UTI symptoms. Patient states she was just treated for UTI. She was tested June 9, July 9 and August 12 and was positive all three times. She was recently treated with macrodantin. tbw

## 2022-05-05 NOTE — Telephone Encounter (Signed)
Patient came in today with complaints of UTI symptoms. Patient states she was just treated for UTI. She was tested June 9, July 9 and August 12 and was positive all three times. She was recently treated with macrodantin. Please advise. tbw

## 2022-05-06 NOTE — Telephone Encounter (Signed)
Called and spoke with patient about the below message. Patient states she has picked up the medication. She will also be giving Dr. Marlou Porch at [facility] a call to schedule an appointment. tbw

## 2022-05-07 LAB — URINE CULTURE

## 2022-05-09 ENCOUNTER — Other Ambulatory Visit (HOSPITAL_BASED_OUTPATIENT_CLINIC_OR_DEPARTMENT_OTHER): Payer: Self-pay | Admitting: Obstetrics & Gynecology

## 2022-05-09 MED ORDER — CIPROFLOXACIN HCL 250 MG PO TABS: 250.0000 mg | ORAL_TABLET | Freq: Two times a day (BID) | ORAL | 0 refills | Status: DC

## 2022-12-08 ENCOUNTER — Other Ambulatory Visit: Payer: Self-pay | Admitting: Emergency Medicine

## 2022-12-08 DIAGNOSIS — E7800 Pure hypercholesterolemia, unspecified: Secondary | ICD-10-CM

## 2023-02-22 ENCOUNTER — Other Ambulatory Visit: Payer: Self-pay | Admitting: Emergency Medicine

## 2023-05-06 ENCOUNTER — Other Ambulatory Visit (HOSPITAL_BASED_OUTPATIENT_CLINIC_OR_DEPARTMENT_OTHER): Payer: Self-pay | Admitting: Obstetrics & Gynecology

## 2023-05-08 NOTE — Telephone Encounter (Signed)
LM for pt to call regarding refill request. Needs appt

## 2023-05-11 ENCOUNTER — Encounter (HOSPITAL_BASED_OUTPATIENT_CLINIC_OR_DEPARTMENT_OTHER): Payer: Self-pay | Admitting: *Deleted

## 2023-06-22 DIAGNOSIS — H2513 Age-related nuclear cataract, bilateral: Secondary | ICD-10-CM | POA: Diagnosis not present

## 2023-11-02 ENCOUNTER — Ambulatory Visit (INDEPENDENT_AMBULATORY_CARE_PROVIDER_SITE_OTHER): Payer: Medicare Other | Admitting: Emergency Medicine

## 2023-11-02 ENCOUNTER — Encounter: Payer: Self-pay | Admitting: Emergency Medicine

## 2023-11-02 VITALS — BP 130/82 | HR 71 | Temp 97.5°F | Ht 67.0 in | Wt 143.0 lb

## 2023-11-02 DIAGNOSIS — Z23 Encounter for immunization: Secondary | ICD-10-CM | POA: Diagnosis not present

## 2023-11-02 DIAGNOSIS — Z0000 Encounter for general adult medical examination without abnormal findings: Secondary | ICD-10-CM | POA: Diagnosis not present

## 2023-11-02 DIAGNOSIS — Z136 Encounter for screening for cardiovascular disorders: Secondary | ICD-10-CM | POA: Diagnosis not present

## 2023-11-02 DIAGNOSIS — Z13820 Encounter for screening for osteoporosis: Secondary | ICD-10-CM

## 2023-11-02 DIAGNOSIS — Z1231 Encounter for screening mammogram for malignant neoplasm of breast: Secondary | ICD-10-CM

## 2023-11-02 DIAGNOSIS — Z131 Encounter for screening for diabetes mellitus: Secondary | ICD-10-CM | POA: Diagnosis not present

## 2023-11-02 LAB — HEPATIC FUNCTION PANEL
ALT: 18 U/L (ref 0–35)
AST: 20 U/L (ref 0–37)
Albumin: 4.5 g/dL (ref 3.5–5.2)
Alkaline Phosphatase: 77 U/L (ref 39–117)
Bilirubin, Direct: 0.1 mg/dL (ref 0.0–0.3)
Total Bilirubin: 0.9 mg/dL (ref 0.2–1.2)
Total Protein: 7 g/dL (ref 6.0–8.3)

## 2023-11-02 LAB — BASIC METABOLIC PANEL
BUN: 5 mg/dL — ABNORMAL LOW (ref 6–23)
CO2: 27 meq/L (ref 19–32)
Calcium: 9.3 mg/dL (ref 8.4–10.5)
Chloride: 98 meq/L (ref 96–112)
Creatinine, Ser: 0.55 mg/dL (ref 0.40–1.20)
GFR: 94.73 mL/min (ref >60.00)
Glucose, Bld: 95 mg/dL (ref 70–99)
Potassium: 4.4 meq/L (ref 3.5–5.1)
Sodium: 133 meq/L — ABNORMAL LOW (ref 135–145)

## 2023-11-02 LAB — VITAMIN D 25 HYDROXY (VIT D DEFICIENCY, FRACTURES): VITD: 74.61 ng/mL (ref 30.00–100.00)

## 2023-11-02 LAB — LIPID PANEL
Cholesterol: 163 mg/dL (ref 0–200)
HDL: 43.8 mg/dL (ref >39.00)
LDL Cholesterol: 88 mg/dL (ref 0–99)
NonHDL: 119.43
Total CHOL/HDL Ratio: 4
Triglycerides: 159 mg/dL — ABNORMAL HIGH (ref 0.0–149.0)
VLDL: 31.8 mg/dL (ref 0.0–40.0)

## 2023-11-02 LAB — VITAMIN B12: Vitamin B-12: 730 pg/mL (ref 211–911)

## 2023-11-02 LAB — TSH: TSH: 0.83 u[IU]/mL (ref 0.35–5.50)

## 2023-11-02 LAB — HEMOGLOBIN A1C: Hgb A1c MFr Bld: 5.3 % (ref 4.6–6.5)

## 2023-11-02 NOTE — Progress Notes (Signed)
 Subjective:    Teresa Fitzgerald is a 68 y.o. female who presents for a Welcome to Medicare exam.          Objective:    Today's Vitals   11/02/23 0855  BP: 130/82  Pulse: 71  Temp: (!) 97.5 F (36.4 C)  TempSrc: Oral  SpO2: 97%  Weight: 143 lb (64.9 kg)  Height: 5\' 7"  (1.702 m)  Body mass index is 22.4 kg/m.  Medications Outpatient Encounter Medications as of 11/02/2023  Medication Sig   albuterol  (VENTOLIN  HFA) 108 (90 Base) MCG/ACT inhaler Inhale 2 puffs into the lungs every 6 (six) hours as needed.   aspirin 81 MG tablet Take 81 mg by mouth daily.   calcium  carbonate (OSCAL) 1500 (600 Ca) MG TABS tablet Take 1 tablet by mouth 2 (two) times daily.   Cetirizine HCl 10 MG CAPS    estradiol  (ESTRACE ) 0.1 MG/GM vaginal cream 1 GRAM VAGINALLY TWICE WEEKLY. APPLY A SMALL AMOUNT TOPICALLY AROUND THE URETHRA AS WELL.   mometasone -formoterol  (DULERA) 200-5 MCG/ACT AERO Inhale 2 puffs into the lungs 2 (two) times daily. inhale 2 puffs into the lungs first thing in the morning once as needed   rosuvastatin  (CRESTOR ) 5 MG tablet TAKE 1 TABLET (5 MG TOTAL) BY MOUTH DAILY.   vitamin B-12 (CYANOCOBALAMIN ) 1000 MCG tablet Take 1,000 mcg by mouth daily.   Vitamin D , Ergocalciferol , (DRISDOL ) 1.25 MG (50000 UNIT) CAPS capsule TAKE 1 CAPSULE (50,000 UNITS TOTAL) BY MOUTH EVERY 7 (SEVEN) DAYS   [DISCONTINUED] ciprofloxacin  (CIPRO ) 250 MG tablet Take 1 tablet (250 mg total) by mouth 2 (two) times daily. (Patient not taking: Reported on 11/02/2023)   No facility-administered encounter medications on file as of 11/02/2023.     History: Past Medical History:  Diagnosis Date   Allergic rhinitis    Allergy    [employer] 10/20/2020   Asthma    onset childhood; avoids trigger; [facility] consultatoin in 2014. Dulera prescribed.   Asthma    [facility] 10/20/2020   Hypercholesteremia    Hyperlipidemia    [facility] 10/20/2020   Mitral valve prolapse    Osteopenia    SVT (supraventricular tachycardia)  (HCC)    presumed atrial tachycardia not inducible on EPS 12/11   Past Surgical History:  Procedure Laterality Date   EP study  12/11   by JA, no inducible arrhythmia   LUMBAR FUSION  2000   SPINE SURGERY N/A    [facility] 10/20/2020   TUBAL LIGATION  1988    Family History  Problem Relation Age of Onset   Asthma Father    Allergies Father    Asthma Paternal Grandfather    Lung cancer Mother        smoked   Hypertension Mother    Allergies Daughter    Colon cancer Neg Hx    Breast cancer Neg Hx    Social History   Occupational History   Occupation: Teacher, 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[employer]    Comment: case Production designer, theatre/television/film for [place]  Tobacco Use   Smoking status: Never   Smokeless tobacco: Never  Substance and Sexual Activity   Alcohol use: Yes    Alcohol/week: 14.0 standard drinks of alcohol    Types: 14 Standard drinks or equivalent per week    Comment: wine   Drug use: No   Sexual activity: Yes    Partners: Male    Birth control/protection: Surgical, Post-menopausal    Comment: BTL    Tobacco  Counseling Non-smoker  Immunizations and Health Maintenance Immunization History  Administered Date(s) Administered   Influenza-Unspecified 06/15/2017, 07/12/2019, 06/25/2020, 07/14/2022, 06/23/2023   Moderna Covid-19 Vaccine Bivalent Booster 73yrs & up 07/14/2022   PFIZER(Purple Top)SARS-COV-2 Vaccination 12/13/2019, 01/03/2020, 09/03/2020   Tdap 07/24/2007, 07/23/2017   Health Maintenance Due  Topic Date Due   Medicare Annual Wellness (AWV)  Never done   Pneumonia Vaccine 75+ Years old (1 of 2 - PCV) Never done   Zoster Vaccines- Shingrix  (1 of 2) Never done   MAMMOGRAM  04/24/2023   COVID-19 Vaccine (5 - 2024-25 season) 05/24/2023    Activities of Daily Living    10/30/2023    1:40 PM  In your present state of health, do you have any difficulty performing the following activities:  Hearing? 0  Vision? 0  Difficulty concentrating or making decisions? 0   Walking or climbing stairs? 0  Dressing or bathing? 0  Doing errands, shopping? 0  Preparing Food and eating ? N  Using the Toilet? N  In the past six months, have you accidently leaked urine? N  Do you have problems with loss of bowel control? N  Managing your Medications? N  Managing your Finances? N  Housekeeping or managing your Housekeeping? N    Physical Exam   Physical Exam Vitals reviewed.  Constitutional:      Appearance: Normal appearance.  HENT:     Head: Normocephalic.     Right Ear: Tympanic membrane, ear canal and external ear normal.     Left Ear: Tympanic membrane, ear canal and external ear normal.     Mouth/Throat:     Mouth: Mucous membranes are moist.     Pharynx: Oropharynx is clear.  Eyes:     Extraocular Movements: Extraocular movements intact.     Conjunctiva/sclera: Conjunctivae normal.     Pupils: Pupils are equal, round, and reactive to light.  Cardiovascular:     Rate and Rhythm: Normal rate and regular rhythm.     Pulses: Normal pulses.     Heart sounds: Normal heart sounds.  Pulmonary:     Effort: Pulmonary effort is normal.     Breath sounds: Normal breath sounds.  Abdominal:     Palpations: Abdomen is soft.     Tenderness: There is no abdominal tenderness.  Musculoskeletal:        General: Normal range of motion.     Cervical back: No tenderness.  Lymphadenopathy:     Cervical: No cervical adenopathy.  Skin:    General: Skin is warm and dry.     Capillary Refill: Capillary refill takes less than 2 seconds.  Neurological:     General: No focal deficit present.     Mental Status: She is alert and oriented to person, place, and time.  Psychiatric:        Mood and Affect: Mood normal.        Behavior: Behavior normal.     Vision Screening   Right eye Left eye Both eyes  Without correction     With correction 20/30 20/40 20/30         EKG:  normal EKG, normal sinus rhythm, no acute ischemic changes.  Normal EKG.       Assessment:    This is a routine wellness examination for this patient .  Clinically stable.  No concerns.  Vision/Hearing screen No deficits detected   Goals   None     Depression Screen    11/02/2023  8:59 AM 03/18/2022    1:50 PM 06/24/2021    2:46 PM 04/22/2021    9:58 AM  PHQ 2/9 Scores  PHQ - 2 Score 0 0 0 0     Fall Risk    11/02/2023    8:59 AM  Fall Risk   Falls in the past year? 0  Number falls in past yr: 0  Injury with Fall? 0  Risk for fall due to : No Fall Risks  Follow up Falls evaluation completed    Cognitive Function: No deficits detected.  No concerns.        Patient Care Team: Elvira Hammersmith, MD as PCP - General (Internal Medicine)     Plan:     I have personally reviewed and noted the following in the patient's chart:   Medical and social history Use of alcohol, tobacco or illicit drugs  Current medications and supplements Functional ability and status Nutritional status Physical activity Advanced directives List of other physicians Hospitalizations, surgeries, and ER visits in previous 12 months Vitals Screenings to include cognitive, depression, and falls Referrals and appointments  In addition, I have reviewed and discussed with patient certain preventive protocols, quality metrics, and best practice recommendations. A written personalized care plan for preventive services as well as general preventive health recommendations were provided to patient.     [address] [place], [place] 11/02/2023

## 2023-11-02 NOTE — Patient Instructions (Signed)

## 2023-11-04 LAB — BASIC METABOLIC PANEL WITH GFR
BUN/Creatinine Ratio: 10 (calc) (ref 6–22)
BUN: 6 mg/dL — ABNORMAL LOW (ref 7–25)
CO2: 23 mmol/L (ref 20–32)
Calcium: 9.8 mg/dL (ref 8.6–10.4)
Chloride: 100 mmol/L (ref 98–110)
Creat: 0.62 mg/dL (ref 0.50–1.05)
Glucose, Bld: 94 mg/dL (ref 65–99)
Potassium: 4.9 mmol/L (ref 3.5–5.3)
Sodium: 136 mmol/L (ref 135–146)
eGFR: 98 mL/min/{1.73_m2} (ref >=60)

## 2023-11-04 LAB — HEPATITIS C ANTIBODY: Hepatitis C Ab: NONREACTIVE

## 2023-11-04 LAB — HIV ANTIBODY (ROUTINE TESTING W REFLEX): HIV 1&2 Ab, 4th Generation: NONREACTIVE

## 2023-12-03 ENCOUNTER — Other Ambulatory Visit: Payer: Self-pay | Admitting: Emergency Medicine

## 2023-12-03 DIAGNOSIS — E7800 Pure hypercholesterolemia, unspecified: Secondary | ICD-10-CM

## 2023-12-07 ENCOUNTER — Encounter: Payer: Self-pay | Admitting: Emergency Medicine

## 2023-12-07 ENCOUNTER — Ambulatory Visit
Admission: RE | Admit: 2023-12-07 | Discharge: 2023-12-07 | Disposition: A | Discharge status: Home / Self Care | Source: Ambulatory Visit | Attending: Emergency Medicine | Admitting: Emergency Medicine

## 2023-12-07 DIAGNOSIS — Z13820 Encounter for screening for osteoporosis: Secondary | ICD-10-CM

## 2023-12-07 NOTE — Progress Notes (Signed)
 Call patient please.  DEXA scan shows osteoporosis.  Recommend Prolia injections.  Thanks.

## 2023-12-15 ENCOUNTER — Ambulatory Visit
Admission: RE | Admit: 2023-12-15 | Discharge: 2023-12-15 | Disposition: A | Discharge status: Home / Self Care | Source: Ambulatory Visit | Attending: Emergency Medicine

## 2023-12-15 DIAGNOSIS — Z1231 Encounter for screening mammogram for malignant neoplasm of breast: Secondary | ICD-10-CM | POA: Diagnosis not present

## 2023-12-15 DIAGNOSIS — Z0000 Encounter for general adult medical examination without abnormal findings: Secondary | ICD-10-CM

## 2023-12-16 ENCOUNTER — Encounter: Payer: Self-pay | Admitting: Emergency Medicine

## 2024-01-25 ENCOUNTER — Other Ambulatory Visit: Payer: Self-pay | Admitting: Emergency Medicine

## 2024-04-25 ENCOUNTER — Ambulatory Visit (HOSPITAL_BASED_OUTPATIENT_CLINIC_OR_DEPARTMENT_OTHER): Payer: Medicare Other | Admitting: Obstetrics & Gynecology

## 2024-04-25 ENCOUNTER — Encounter (HOSPITAL_BASED_OUTPATIENT_CLINIC_OR_DEPARTMENT_OTHER): Payer: Self-pay | Admitting: Obstetrics & Gynecology

## 2024-04-25 VITALS — BP 130/69 | HR 49 | Ht 67.0 in | Wt 137.6 lb

## 2024-04-25 DIAGNOSIS — Z01419 Encounter for gynecological examination (general) (routine) without abnormal findings: Secondary | ICD-10-CM | POA: Diagnosis not present

## 2024-04-25 DIAGNOSIS — N390 Urinary tract infection, site not specified: Secondary | ICD-10-CM

## 2024-04-25 DIAGNOSIS — M810 Age-related osteoporosis without current pathological fracture: Secondary | ICD-10-CM

## 2024-04-25 DIAGNOSIS — N952 Postmenopausal atrophic vaginitis: Secondary | ICD-10-CM | POA: Diagnosis not present

## 2024-04-25 MED ORDER — ESTRADIOL 0.1 MG/GM VA CREA: TOPICAL_CREAM | VAGINAL | 6 refills | Status: AC

## 2024-04-25 MED ORDER — ALENDRONATE SODIUM 70 MG PO TABS: 70.0000 mg | ORAL_TABLET | ORAL | 3 refills | Status: AC

## 2024-04-25 NOTE — Progress Notes (Signed)
 Breast and Pelvic Exam Patient name: Teresa Fitzgerald MRN [id number]  Date of birth: 08-05-1956 Chief Complaint:   Breast and Pelvic Exam  History of Present Illness:   Teresa Fitzgerald is a 68 y.o. G3P3 Caucasian female being seen today for breast and pelvic exam.   Doing well.  Denies vaginal bleeding.    Had BMD ordered by Dr. Purcell.  Osteoporosis noted in forearm but worsening osteopenia in hip.  Prolia recommended.  Pt does not want this.  Discussed findings and options for treatment today.  Willing to try fosamax .  Side effects of GERD and osteonecrosis discussed.    Using vaginal estradiol  cream.  Hasn't had a UTI in more than a year.  She is very pleased with this.    Patient's last menstrual period was 09/22/2008 (approximate).   Last pap 03/18/2022. Results were: Normal, NEG HPV. H/O abnormal pap: no Last mammogram: 12/15/23. Results were: normal. Family h/o breast cancer: no Last colonoscopy: 04/23/2016. Results were: normal. Family h/o colorectal cancer: no.   Dexa:   12/15/2023 - 2.9     11/02/2023    8:59 AM 03/18/2022    1:50 PM 06/24/2021    2:46 PM 04/22/2021    9:58 AM 10/22/2020   10:50 AM  Depression screen PHQ 2/9  Decreased Interest 0 0 0 0 0  Down, Depressed, Hopeless 0 0 0 0 0  PHQ - 2 Score 0 0 0 0 0    Review of Systems:   Pertinent items are noted in HPI Denies any pelvic pain.  Denies urinary or bowel changes.   Pertinent History Reviewed:  Reviewed past medical,surgical, social and family history.  Reviewed problem list, medications and allergies. Physical Assessment:   Vitals:   04/25/24 1356  BP: 130/69  Pulse: (!) 49  SpO2: 100%  Weight: 137 lb 9.1 oz (62.4 kg)  Height: 5' 7 (1.702 m)  Body mass index is 21.55 kg/m.        Physical Examination:   General appearance - well appearing, and in no distress  Mental status - alert, oriented to person, place, and time  Psych:  She has a normal mood and affect  Skin - warm and dry, normal color, no  suspicious lesions noted  Chest - effort normal, all lung fields clear to auscultation bilaterally  Heart - normal rate and regular rhythm  Neck:  midline trachea, no thyromegaly or nodules  Breasts - breasts appear normal, no suspicious masses, no skin or nipple changes or  axillary nodes  Abdomen - soft, nontender, nondistended, no masses or organomegaly  Pelvic - VULVA: normal appearing vulva with no masses, tenderness or lesions   VAGINA: normal appearing vagina with normal color and discharge, no lesions   CERVIX: normal appearing cervix without discharge or lesions, no CMT  Thin prep pap is not indicated today  UTERUS: uterus is felt to be normal size, shape, consistency and nontender   ADNEXA: No adnexal masses or tenderness noted.  Rectal - normal rectal, good sphincter tone, no masses felt.   Extremities:  No swelling or varicosities noted  Chaperone present for exam  No results found for this or any previous visit (from the past 24 hours).  Assessment & Plan:  1. Encntr for gyn exam (general) (routine) w/o abn findings (Primary) - Pap smear 2023.  Pt comfortable with stopping pap smears at this time.  Not SA.   - Mammogram 11/2023. - Colonoscopy 2017.  Follow up 10 years -  Bone mineral density 11/2023 - lab work done with PCP, Dr. Purcell - vaccines reviewed/updated  2. Age-related osteoporosis without current pathological fracture - alendronate  (FOSAMAX ) 70 MG tablet; Take 1 tablet (70 mg total) by mouth every 7 (seven) days. Take first thing in am with 6 oz. Water.  Be upright after taking.  Eat nothing for one hour.  Dispense: 12 tablet; Refill: 3  3. Recurrent UTI - estradiol  (ESTRACE ) 0.1 MG/GM vaginal cream; 1 gram vaginally twice weekly.  Apply a small amount topically around the urethra as well.  Dispense: 42.5 g; Refill: 6  4. Vaginal atrophy   No orders of the defined types were placed in this encounter.   Meds:  Meds ordered this encounter  Medications    alendronate  (FOSAMAX ) 70 MG tablet    Sig: Take 1 tablet (70 mg total) by mouth every 7 (seven) days. Take first thing in am with 6 oz. Water.  Be upright after taking.  Eat nothing for one hour.    Dispense:  12 tablet    Refill:  3   estradiol  (ESTRACE ) 0.1 MG/GM vaginal cream    Sig: 1 gram vaginally twice weekly.  Apply a small amount topically around the urethra as well.    Dispense:  42.5 g    Refill:  6    Follow-up: recheck 1-2 years  Ronal GORMAN Pinal, MD 04/25/2024 3:00 PM

## 2024-07-06 DIAGNOSIS — H2513 Age-related nuclear cataract, bilateral: Secondary | ICD-10-CM | POA: Diagnosis not present

## 2024-07-26 ENCOUNTER — Ambulatory Visit

## 2024-07-26 ENCOUNTER — Encounter: Payer: Self-pay | Admitting: Emergency Medicine

## 2024-07-26 ENCOUNTER — Ambulatory Visit (INDEPENDENT_AMBULATORY_CARE_PROVIDER_SITE_OTHER): Admitting: Emergency Medicine

## 2024-07-26 ENCOUNTER — Ambulatory Visit: Payer: Self-pay

## 2024-07-26 ENCOUNTER — Ambulatory Visit: Payer: Self-pay | Admitting: Emergency Medicine

## 2024-07-26 VITALS — BP 118/80 | HR 41 | Temp 98.0°F | Ht 67.0 in | Wt 137.0 lb

## 2024-07-26 DIAGNOSIS — Z23 Encounter for immunization: Secondary | ICD-10-CM

## 2024-07-26 DIAGNOSIS — S3992XA Unspecified injury of lower back, initial encounter: Secondary | ICD-10-CM | POA: Diagnosis not present

## 2024-07-26 DIAGNOSIS — Z8739 Personal history of other diseases of the musculoskeletal system and connective tissue: Secondary | ICD-10-CM | POA: Diagnosis not present

## 2024-07-26 DIAGNOSIS — M7918 Myalgia, other site: Secondary | ICD-10-CM | POA: Diagnosis not present

## 2024-07-26 MED ORDER — TRAMADOL HCL 50 MG PO TABS: 50.0000 mg | ORAL_TABLET | Freq: Three times a day (TID) | ORAL | 1 refills | Status: AC | PRN

## 2024-07-26 NOTE — Telephone Encounter (Signed)
 FYI Only or Action Required?: FYI only for provider: appointment scheduled on 11.4.25.  Patient was last seen in primary care on 11/02/2023 by Purcell Emil Schanz, MD.  Called Nurse Triage reporting Pain.  Symptoms began several weeks ago.  Interventions attempted: OTC medications: lidocaine patches, Rest, hydration, or home remedies, and Ice/heat application.  Symptoms are: gradually worsening.  Triage Disposition: See HCP Within 4 Hours (Or PCP Triage)  Patient/caregiver understands and will follow disposition?: Yes     Copied from CRM [id number]. Topic: Clinical - Red Word Triage >> Jul 26, 2024 12:56 PM Ahlexyia S wrote: Red Word that prompted transfer to Nurse Triage: Pt called in stating that she fell a couple of weeks ago. Pt is having a hard time walking and is frequent pain. Pt stated that if she moves wrong then her pain is sharp and intense. Pt is also experiencing some discomfort. Warm transferred to nurse triage. Reason for Disposition  [1] SEVERE back pain (e.g., excruciating, unable to do any normal activities) AND [2] not improved 2 hours after pain medicine  Answer Assessment - Initial Assessment Questions PT states she was walking in the woods about 3 weeks ago and belly flopped on the ground. She states that she had some initial back pain and shoulder pain. Shoulder pain started to get better and is resolved now but the back pain is getting worse. She states by the end of the day she can hardly walk. She states a heating pad can bring it to 0 but then she gets up and goes right back up.    1. ONSET: When did the pain begin? (e.g., minutes, hours, days)     3 weeks ago 2. LOCATION: Where does it hurt? (upper, mid or lower back)     Left, left flank side radiating  3. SEVERITY: How bad is the pain?  (e.g., Scale 1-10; mild, moderate, or severe)     Sitting- 3, with moving sideways about 8 sharp and acute 4. PATTERN: Is the pain constant? (e.g., yes, no;  constant, intermittent)      Intermittent, worse with certain movements 5. RADIATION: Does the pain shoot into your legs or somewhere else?     Radiates to left flank 6. CAUSE:  What do you think is causing the back pain?      Thinks it's from her fall.  7. BACK OVERUSE:  Any recent lifting of heavy objects, strenuous work or exercise?     no 8. MEDICINES: What have you taken so far for the pain? (e.g., nothing, acetaminophen, NSAIDS)     Lidocaine patches, Kinesiology tape, heat 9. NEUROLOGIC SYMPTOMS: Do you have any weakness, numbness, or problems with bowel/bladder control?     no 10. OTHER SYMPTOMS: Do you have any other symptoms? (e.g., fever, abdomen pain, burning with urination, blood in urine)       no  Protocols used: Back Pain-A-AH

## 2024-07-26 NOTE — Progress Notes (Unsigned)
 Teresa Fitzgerald 68 y.o.   Chief Complaint  Patient presents with   Back Pain    Pt states that she had a fall about 3 weeks ago and possibly thought she had pull something but now the pain is a level 10    HISTORY OF PRESENT ILLNESS: This is a 68 y.o. female complaining of left sided mid to low back pain that started 3 weeks ago after a fall Fell forward.  Injured left shoulder.  Shoulder doing better.  Still having persistent left-sided back pain History of osteoporosis  Back Pain Pertinent negatives include no abdominal pain, chest pain, dysuria, fever or headaches.     Prior to Admission medications   Medication Sig Start Date End Date Taking? Authorizing Provider  albuterol  (VENTOLIN  HFA) 108 (90 Base) MCG/ACT inhaler Inhale 2 puffs into the lungs every 6 (six) hours as needed. 04/22/21   Dierdra Salameh Jose, MD  alendronate  (FOSAMAX ) 70 MG tablet Take 1 tablet (70 mg total) by mouth every 7 (seven) days. Take first thing in am with 6 oz. Water.  Be upright after taking.  Eat nothing for one hour. 04/25/24   Cleotilde Ronal RAMAN, MD  aspirin 81 MG tablet Take 81 mg by mouth daily.    [provider]  calcium  carbonate (OSCAL) 1500 (600 Ca) MG TABS tablet Take 1 tablet by mouth 2 (two) times daily.    [provider]  Cetirizine HCl 10 MG CAPS     [provider]  estradiol  (ESTRACE ) 0.1 MG/GM vaginal cream 1 gram vaginally twice weekly.  Apply a small amount topically around the urethra as well. 04/25/24   Cleotilde Ronal RAMAN, MD  mometasone -formoterol  (DULERA) 200-5 MCG/ACT AERO Inhale 2 puffs into the lungs 2 (two) times daily. inhale 2 puffs into the lungs first thing in the morning once as needed 04/22/21   Purcell Emil Schanz, MD  rosuvastatin  (CRESTOR ) 5 MG tablet TAKE 1 TABLET (5 MG TOTAL) BY MOUTH DAILY. 12/03/23   Christpher Stogsdill Jose, MD  vitamin B-12 (CYANOCOBALAMIN ) 1000 MCG tablet Take 1,000 mcg by mouth daily.    [provider]  Vitamin D ,  Ergocalciferol , (DRISDOL ) 1.25 MG (50000 UNIT) CAPS capsule TAKE 1 CAPSULE (50,000 UNITS TOTAL) BY MOUTH EVERY 7 (SEVEN) DAYS 01/25/24   Purcell Emil Schanz, MD    Allergies  Allergen Reactions   Oatmeal     unknown   Other     ALL Tree nuts - swelling of mouth and throat, hives    Patient Active Problem List   Diagnosis Date Noted   Dyslipidemia 04/22/2021   Age-related osteoporosis without current pathological fracture 10/21/2018   Seasonal allergic rhinitis due to pollen 04/11/2017   Other nonrheumatic mitral valve disorders 01/08/2016   SVT/ PSVT/ PAT 12/28/2009   Asthma 12/27/2009   HYPERCHOLESTEROLEMIA 10/26/2009    Past Medical History:  Diagnosis Date   Allergic rhinitis    Allergy    [employer] 10/20/2020   Asthma    onset childhood; avoids trigger; [facility] consultatoin in 2014. Dulera prescribed.   Asthma    [facility] 10/20/2020   Hypercholesteremia    Hyperlipidemia    [employer] 10/20/2020   Mitral valve prolapse    Osteopenia    SVT (supraventricular tachycardia)    presumed atrial tachycardia not inducible on EPS 12/11    Past Surgical History:  Procedure Laterality Date   EP study  12/11   by JA, no inducible arrhythmia   LUMBAR FUSION  2000  SPINE SURGERY N/A    [employer] 10/20/2020   TUBAL LIGATION  1988    Social History   Socioeconomic History   Marital status: Married    Spouse name: Not on file   Number of children: 3   Years of education: Not on file   Highest education level: Associate degree: academic program  Occupational History   Occupation: Teacher, Adult Education: [employer]    Comment: case production designer, theatre/television/film for [employer]  Tobacco Use   Smoking status: Never   Smokeless tobacco: Never  Substance and Sexual Activity   Alcohol use: Yes    Alcohol/week: 14.0 standard drinks of alcohol    Types: 14 Standard drinks or equivalent per week    Comment: wine   Drug use: No   Sexual activity: Yes    Partners: Male    Birth  control/protection: Surgical, Post-menopausal    Comment: BTL  Other Topics Concern   Not on file  Social History Narrative   Marital status: married x 9 years; fourth marriage       Children: 3 daughters; 9 grandchildren; no gg      Lives: with husband      Employment: Microbiologist; 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  Walking 1.5-2.0 miles per day.   Social Drivers of Corporate Investment Banker Strain: Low Risk  (10/30/2023)   Overall Financial Resource Strain (CARDIA)    Difficulty of Paying Living Expenses: Not hard at all  Food Insecurity: No Food Insecurity (10/30/2023)   Hunger Vital Sign    Worried About Running Out of Food in the Last Year: Never true    Ran Out of Food in the Last Year: Never true  Transportation Needs: No Transportation Needs (10/30/2023)   PRAPARE - Administrator, Civil Service (Medical): No    Lack of Transportation (Non-Medical): No  Physical Activity: Insufficiently Active (10/30/2023)   Exercise Vital Sign    Days of Exercise per Week: 7 days    Minutes of Exercise per Session: 20 min  Stress: Stress Concern Present (10/30/2023)   [employer] of Occupational Health - Occupational Stress Questionnaire    Feeling of Stress : To some extent  Social Connections: Moderately Integrated (10/30/2023)   Social Connection and Isolation Panel    Frequency of Communication with Friends and Family: More than three times a week    Frequency of Social Gatherings with Friends and Family: Twice a week    Attends Religious Services: Never    Database Administrator or Organizations: Yes    Attends Engineer, Structural: More than 4 times per year    Marital Status: Married  Catering Manager Violence: Not on file    Family History  Problem Relation Age of Onset   Asthma Father    Allergies Father    Asthma Paternal  Grandfather    Lung cancer Mother        smoked   Hypertension Mother    Allergies Daughter    Colon cancer Neg Hx    Breast cancer Neg Hx      Review of Systems  Constitutional: Negative.  Negative for chills and fever.  HENT: Negative.  Negative for congestion and sore throat.   Respiratory: Negative.  Negative for cough  and shortness of breath.   Cardiovascular: Negative.  Negative for chest pain and palpitations.  Gastrointestinal:  Negative for abdominal pain, diarrhea, nausea and vomiting.  Genitourinary: Negative.  Negative for dysuria and hematuria.  Musculoskeletal:  Positive for back pain.  Skin: Negative.  Negative for rash.  Neurological: Negative.  Negative for dizziness and headaches.  All other systems reviewed and are negative.   Today's Vitals   07/26/24 1457  BP: 118/80  Pulse: (!) 41  Temp: 98 F (36.7 C)  TempSrc: Oral  SpO2: 97%  Weight: 137 lb (62.1 kg)  Height: 5' 7 (1.702 m)   Body mass index is 21.46 kg/m.   Physical Exam Vitals reviewed.  Constitutional:      Appearance: Normal appearance.  HENT:     Head: Normocephalic.  Eyes:     Extraocular Movements: Extraocular movements intact.  Cardiovascular:     Rate and Rhythm: Normal rate.  Pulmonary:     Effort: Pulmonary effort is normal.  Musculoskeletal:     Lumbar back: Tenderness (Left flank area) present. No bony tenderness.  Skin:    General: Skin is warm and dry.     Capillary Refill: Capillary refill takes less than 2 seconds.  Neurological:     General: No focal deficit present.     Mental Status: She is alert and oriented to person, place, and time.  Psychiatric:        Mood and Affect: Mood normal.        Behavior: Behavior normal.   DG Thoracic Spine 2 View Result Date: 07/26/2024 CLINICAL DATA:  Back pain. EXAM: THORACIC SPINE 2 VIEWS COMPARISON:  None Available. FINDINGS: No acute fracture or subluxation of the thoracic spine. The bones are osteopenic. Multilevel  degenerative changes. The soft tissues are unremarkable IMPRESSION: No acute findings. Multilevel degenerative changes. Electronically Signed   By: Vanetta Chou M.D.   On: 07/26/2024 16:04   DG Lumbar Spine 2-3 Views Result Date: 07/26/2024 CLINICAL DATA:  Back pain. EXAM: LUMBAR SPINE - 2-3 VIEW COMPARISON:  None Available. FINDINGS: No acute fracture or subluxation of the lumbar spine. Evaluation is however limited due to osteopenia, degenerative changes, and scoliosis. L2-L3 and L3-L4 ray cages noted. The soft tissues are unremarkable. IMPRESSION: 1. No acute fracture or subluxation. 2. Degenerative changes and scoliosis. Electronically Signed   By: Vanetta Chou M.D.   On: 07/26/2024 16:03     ASSESSMENT & PLAN: Problem List Items Addressed This Visit       Other   Musculoskeletal pain   Pain management discussed. Recommend Tylenol for mild to moderate pain and tramadol for moderate to severe pain      Relevant Medications   traMADol (ULTRAM) 50 MG tablet   Other Relevant Orders   Ambulatory referral to Physical Therapy   Back injury, initial encounter - Primary   Persistent pain affecting quality of life However clinically stable with no red flag signs or symptoms. Recommend x-rays today.  Will review images when available Pain management discussed Differential diagnosis discussed Recommend Tylenol for mild to moderate pain and tramadol for moderate to severe pain      Relevant Medications   traMADol (ULTRAM) 50 MG tablet   Other Relevant Orders   DG Lumbar Spine 2-3 Views   DG Thoracic Spine 2 View   Ambulatory referral to Physical Therapy   History of osteoporosis   Doubt compression fracture Continues treatment with weekly Fosamax       Relevant Orders  DG Lumbar Spine 2-3 Views   DG Thoracic Spine 2 View   Other Visit Diagnoses       Flu vaccine need       Relevant Orders   Flu vaccine HIGH DOSE PF(Fluzone Trivalent)      Patient Instructions   Acute Back Pain, Adult Acute back pain is sudden and usually short-lived. It is often caused by an injury to the muscles and tissues in the back. The injury may result from: A muscle, tendon, or ligament getting overstretched or torn. Ligaments are tissues that connect bones to each other. Lifting something improperly can cause a back strain. Wear and tear (degeneration) of the spinal disks. Spinal disks are circular tissue that provide cushioning between the bones of the spine (vertebrae). Twisting motions, such as while playing sports or doing yard work. A hit to the back. Arthritis. You may have a physical exam, lab tests, and imaging tests to find the cause of your pain. Acute back pain usually goes away with rest and home care. Follow these instructions at home: Managing pain, stiffness, and swelling Take over-the-counter and prescription medicines only as told by your health care provider. Treatment may include medicines for pain and inflammation that are taken by mouth or applied to the skin, or muscle relaxants. Your health care provider may recommend applying ice during the first 24-48 hours after your pain starts. To do this: Put ice in a plastic bag. Place a towel between your skin and the bag. Leave the ice on for 20 minutes, 2-3 times a day. Remove the ice if your skin turns bright red. This is very important. If you cannot feel pain, heat, or cold, you have a greater risk of damage to the area. If directed, apply heat to the affected area as often as told by your health care provider. Use the heat source that your health care provider recommends, such as a moist heat pack or a heating pad. Place a towel between your skin and the heat source. Leave the heat on for 20-30 minutes. Remove the heat if your skin turns bright red. This is especially important if you are unable to feel pain, heat, or cold. You have a greater risk of getting burned. Activity  Do not stay in bed. Staying  in bed for more than 1-2 days can delay your recovery. Sit up and stand up straight. Avoid leaning forward when you sit or hunching over when you stand. If you work at a desk, sit close to it so you do not need to lean over. Keep your chin tucked in. Keep your neck drawn back, and keep your elbows bent at a 90-degree angle (right angle). Sit high and close to the steering wheel when you drive. Add lower back (lumbar) support to your car seat, if needed. Take short walks on even surfaces as soon as you are able. Try to increase the length of time you walk each day. Do not sit, drive, or stand in one place for more than 30 minutes at a time. Sitting or standing for long periods of time can put stress on your back. Do not drive or use heavy machinery while taking prescription pain medicine. Use proper lifting techniques. When you bend and lift, use positions that put less stress on your back: [place] your knees. Keep the load close to your body. Avoid twisting. Exercise regularly as told by your health care provider. Exercising helps your back heal faster and helps prevent back injuries by  keeping muscles strong and flexible. Work with a physical therapist to make a safe exercise program, as recommended by your health care provider. Do any exercises as told by your physical therapist. Lifestyle Maintain a healthy weight. Extra weight puts stress on your back and makes it difficult to have good posture. Avoid activities or situations that make you feel anxious or stressed. Stress and anxiety increase muscle tension and can make back pain worse. Learn ways to manage anxiety and stress, such as through exercise. General instructions Sleep on a firm mattress in a comfortable position. Try lying on your side with your knees slightly bent. If you lie on your back, put a pillow under your knees. Keep your head and neck in a straight line with your spine (neutral position) when using electronic equipment like  smartphones or pads. To do this: Raise your smartphone or pad to look at it instead of bending your head or neck to look down. Put the smartphone or pad at the level of your face while looking at the screen. Follow your treatment plan as told by your health care provider. This may include: Cognitive or behavioral therapy. Acupuncture or massage therapy. Meditation or yoga. Contact a health care provider if: You have pain that is not relieved with rest or medicine. You have increasing pain going down into your legs or buttocks. Your pain does not improve after 2 weeks. You have pain at night. You lose weight without trying. You have a fever or chills. You develop nausea or vomiting. You develop abdominal pain. Get help right away if: You develop new bowel or bladder control problems. You have unusual weakness or numbness in your arms or legs. You feel faint. These symptoms may represent a serious problem that is an emergency. Do not wait to see if the symptoms will go away. Get medical help right away. Call your local emergency services (911 in the [place] Do not drive yourself to the hospital. Summary Acute back pain is sudden and usually short-lived. Use proper lifting techniques. When you bend and lift, use positions that put less stress on your back. Take over-the-counter and prescription medicines only as told by your health care provider, and apply heat or ice as told. This information is not intended to replace advice given to you by your health care provider. Make sure you discuss any questions you have with your health care provider. Document Revised: 11/30/2020 Document Reviewed: 11/30/2020 Elsevier Patient Education  2024 Elsevier Inc.    Emil Schaumann, MD [facility]

## 2024-07-26 NOTE — Assessment & Plan Note (Signed)
 Doubt compression fracture Continues treatment with weekly Fosamax 

## 2024-07-26 NOTE — Assessment & Plan Note (Signed)
 Persistent pain affecting quality of life However clinically stable with no red flag signs or symptoms. Recommend x-rays today.  Will review images when available Pain management discussed Differential diagnosis discussed Recommend Tylenol for mild to moderate pain and tramadol for moderate to severe pain

## 2024-07-26 NOTE — Patient Instructions (Signed)
 Acute Back Pain, Adult Acute back pain is sudden and usually short-lived. It is often caused by an injury to the muscles and tissues in the back. The injury may result from: A muscle, tendon, or ligament getting overstretched or torn. Ligaments are tissues that connect bones to each other. Lifting something improperly can cause a back strain. Wear and tear (degeneration) of the spinal disks. Spinal disks are circular tissue that provide cushioning between the bones of the spine (vertebrae). Twisting motions, such as while playing sports or doing yard work. A hit to the back. Arthritis. You may have a physical exam, lab tests, and imaging tests to find the cause of your pain. Acute back pain usually goes away with rest and home care. Follow these instructions at home: Managing pain, stiffness, and swelling Take over-the-counter and prescription medicines only as told by your health care provider. Treatment may include medicines for pain and inflammation that are taken by mouth or applied to the skin, or muscle relaxants. Your health care provider may recommend applying ice during the first 24-48 hours after your pain starts. To do this: Put ice in a plastic bag. Place a towel between your skin and the bag. Leave the ice on for 20 minutes, 2-3 times a day. Remove the ice if your skin turns bright red. This is very important. If you cannot feel pain, heat, or cold, you have a greater risk of damage to the area. If directed, apply heat to the affected area as often as told by your health care provider. Use the heat source that your health care provider recommends, such as a moist heat pack or a heating pad. Place a towel between your skin and the heat source. Leave the heat on for 20-30 minutes. Remove the heat if your skin turns bright red. This is especially important if you are unable to feel pain, heat, or cold. You have a greater risk of getting burned. Activity  Do not stay in bed. Staying in  bed for more than 1-2 days can delay your recovery. Sit up and stand up straight. Avoid leaning forward when you sit or hunching over when you stand. If you work at a desk, sit close to it so you do not need to lean over. Keep your chin tucked in. Keep your neck drawn back, and keep your elbows bent at a 90-degree angle (right angle). Sit high and close to the steering wheel when you drive. Add lower back (lumbar) support to your car seat, if needed. Take short walks on even surfaces as soon as you are able. Try to increase the length of time you walk each day. Do not sit, drive, or stand in one place for more than 30 minutes at a time. Sitting or standing for long periods of time can put stress on your back. Do not drive or use heavy machinery while taking prescription pain medicine. Use proper lifting techniques. When you bend and lift, use positions that put less stress on your back: [place] your knees. Keep the load close to your body. Avoid twisting. Exercise regularly as told by your health care provider. Exercising helps your back heal faster and helps prevent back injuries by keeping muscles strong and flexible. Work with a physical therapist to make a safe exercise program, as recommended by your health care provider. Do any exercises as told by your physical therapist. Lifestyle Maintain a healthy weight. Extra weight puts stress on your back and makes it difficult to have good  posture. Avoid activities or situations that make you feel anxious or stressed. Stress and anxiety increase muscle tension and can make back pain worse. Learn ways to manage anxiety and stress, such as through exercise. General instructions Sleep on a firm mattress in a comfortable position. Try lying on your side with your knees slightly bent. If you lie on your back, put a pillow under your knees. Keep your head and neck in a straight line with your spine (neutral position) when using electronic equipment like  smartphones or pads. To do this: Raise your smartphone or pad to look at it instead of bending your head or neck to look down. Put the smartphone or pad at the level of your face while looking at the screen. Follow your treatment plan as told by your health care provider. This may include: Cognitive or behavioral therapy. Acupuncture or massage therapy. Meditation or yoga. Contact a health care provider if: You have pain that is not relieved with rest or medicine. You have increasing pain going down into your legs or buttocks. Your pain does not improve after 2 weeks. You have pain at night. You lose weight without trying. You have a fever or chills. You develop nausea or vomiting. You develop abdominal pain. Get help right away if: You develop new bowel or bladder control problems. You have unusual weakness or numbness in your arms or legs. You feel faint. These symptoms may represent a serious problem that is an emergency. Do not wait to see if the symptoms will go away. Get medical help right away. Call your local emergency services (911 in the [place] Do not drive yourself to the hospital. Summary Acute back pain is sudden and usually short-lived. Use proper lifting techniques. When you bend and lift, use positions that put less stress on your back. Take over-the-counter and prescription medicines only as told by your health care provider, and apply heat or ice as told. This information is not intended to replace advice given to you by your health care provider. Make sure you discuss any questions you have with your health care provider. Document Revised: 11/30/2020 Document Reviewed: 11/30/2020 Elsevier Patient Education  2024 [employer].

## 2024-07-26 NOTE — Assessment & Plan Note (Signed)
 Pain management discussed Recommend Tylenol for mild to moderate pain and tramadol for moderate to severe pain

## 2024-07-27 DIAGNOSIS — Z23 Encounter for immunization: Secondary | ICD-10-CM | POA: Diagnosis not present

## 2024-08-09 ENCOUNTER — Encounter: Payer: Self-pay | Admitting: Physical Therapy

## 2024-08-09 ENCOUNTER — Other Ambulatory Visit: Payer: Self-pay

## 2024-08-09 ENCOUNTER — Ambulatory Visit: Attending: Emergency Medicine | Admitting: Physical Therapy

## 2024-08-09 DIAGNOSIS — M5459 Other low back pain: Secondary | ICD-10-CM | POA: Diagnosis present

## 2024-08-09 DIAGNOSIS — M7918 Myalgia, other site: Secondary | ICD-10-CM | POA: Diagnosis not present

## 2024-08-09 DIAGNOSIS — S3992XA Unspecified injury of lower back, initial encounter: Secondary | ICD-10-CM | POA: Insufficient documentation

## 2024-08-09 NOTE — Therapy (Signed)
 OUTPATIENT PHYSICAL THERAPY THORACOLUMBAR EVALUATION   Patient Name: Teresa Fitzgerald MRN: [id number] DOB:[birth year], 68 y.o., female Today's Date: 08/09/2024  END OF SESSION:  PT End of Session - 08/09/24 1356     Visit Number 1    Number of Visits 10    Date for Recertification  10/18/24    Authorization Type [employer] Medicare    PT Start Time 1358    PT Stop Time 1440    PT Time Calculation (min) 42 min    Activity Tolerance Patient tolerated treatment well    Behavior During Therapy [facility] for tasks assessed/performed          Past Medical History:  Diagnosis Date   Allergic rhinitis    Allergy    [employer] 10/20/2020   Asthma    onset childhood; avoids trigger; Wert consultatoin in 2014. Dulera prescribed.   Asthma    [employer] 10/20/2020   Hypercholesteremia    Hyperlipidemia    [employer] 10/20/2020   Mitral valve prolapse    Osteopenia    SVT (supraventricular tachycardia)    presumed atrial tachycardia not inducible on EPS 12/11   Past Surgical History:  Procedure Laterality Date   EP study  12/11   by JA, no inducible arrhythmia   LUMBAR FUSION  2000   SPINE SURGERY N/A    [employer] 10/20/2020   TUBAL LIGATION  1988   Patient Active Problem List   Diagnosis Date Noted   Musculoskeletal pain 07/26/2024   Back injury, initial encounter 07/26/2024   History of osteoporosis 07/26/2024   Dyslipidemia 04/22/2021   Age-related osteoporosis without current pathological fracture 10/21/2018   Seasonal allergic rhinitis due to pollen 04/11/2017   Other nonrheumatic mitral valve disorders 01/08/2016   SVT/ PSVT/ PAT 12/28/2009   Asthma 12/27/2009   HYPERCHOLESTEROLEMIA 10/26/2009    PCP: Emil Aloysius Schaumann, MD  REFERRING PROVIDER: Emil Aloysius Schaumann, MD  REFERRING DIAG: [phone] (ICD-10-CM) - Back injury, initial encounter   M79.18 (ICD-10-CM) - Musculoskeletal pain  Rationale for Evaluation and Treatment: Rehabilitation  THERAPY DIAG:  Other low back pain -  Plan: PT plan of care cert/re-cert  ONSET DATE: October 2025  SUBJECTIVE:                                                                                                                                                                                           SUBJECTIVE STATEMENT: Pt states that in the beginning of October, she fell and hurt her shoulder and her low back while walking down an embankment, things improved and she feels she may have done too much and had a flare up.  Went to doctor who ruled out new pathology. Overall symptoms have been improving, described as dull ache. She has tried heat/cold/KT tape. 1/10-8/10 range depending on time of day, worse in evenings or at the end of the day. Pt denies radicular symptoms. Notes that low back pain is more on the left side. Anything elongating the left side will inc symptoms. Reports hx of scoliosis.  PERTINENT HISTORY:  Back injury, initial encounter - Primary   Persistent pain affecting quality of life However clinically stable with no red flag signs or symptoms. Recommend x-rays today.  Will review images when available Pain management discussed Differential diagnosis discussed Recommend Tylenol for mild to moderate pain and tramadol for moderate to severe pain    PAIN:  Are you having pain? Yes: NPRS scale: 2/10 Pain location: L low back  Pain description: ache/burn Aggravating factors: reaching overhead, stretching, end of day, sitting up straight  Relieving factors: laying flat, bending forward  PRECAUTIONS: None  RED FLAGS: None   WEIGHT BEARING RESTRICTIONS: No  FALLS:  Has patient fallen in last 6 months? Yes. 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  DIAGNOSTIC FINDINGS:  FINDINGS: No acute fracture or subluxation of the thoracic spine. The bones are osteopenic. Multilevel degenerative changes. The soft tissues are unremarkable   IMPRESSION: No acute findings. Multilevel degenerative changes.  FINDINGS: No acute fracture or subluxation of the lumbar spine. Evaluation is however limited due to osteopenia, degenerative changes, and scoliosis. L2-L3 and L3-L4 ray cages noted. The soft tissues are unremarkable.   IMPRESSION: 1. No acute fracture or subluxation. 2. Degenerative changes and scoliosis.  PATIENT SURVEYS:  Modified Oswestry:  MODIFIED OSWESTRY DISABILITY SCALE  Date: 08/09/24 Score  Pain intensity 1 = The pain is bad, but I can manage without having to take pain medication  2. Personal care (washing, dressing, etc.) 2 =  It is painful to take care of myself, and I am slow and careful.  3. Lifting 3 = Pain prevents me from lifting heavy weights, but I can manage light to medium weights if they are conveniently positioned  4. Walking 2 =  Pain prevents me from walking more than  mile.  5. Sitting 3 =  Pain prevents me from sitting more than  hour.  6. Standing 4 =  Pain prevents me from standing more than 10 minutes.  7. Sleeping 1 = I can sleep well only by using pain medication.  8. Social Life 3 =  Pain prevents me from going out very often.  9. Traveling 4 = My pain restricts my travel to short necessary journeys under 1/2 hour.  10. Employment/ Homemaking 2 = I can perform most of my homemaking/job duties, but pain prevents me from performing more physically stressful activities (eg, lifting, vacuuming).  Total 25/50   Interpretation of scores: Score Category Description  0-20% Minimal Disability The patient can cope with most living activities. Usually no treatment is indicated apart from  advice on lifting, sitting and exercise  21-40% Moderate Disability The patient experiences more pain and difficulty with sitting, lifting and standing. Travel and social life are more difficult and they may be  disabled from work. Personal care, sexual activity and sleeping are not grossly affected, and the patient can usually be managed by conservative means  41-60% Severe Disability Pain remains the main problem in this group, but activities of daily living are affected. These patients require a detailed investigation  61-80% Crippled Back pain impinges on all aspects of the patient's life. Positive intervention is required  81-100% Bed-bound These patients are either bed-bound or exaggerating their symptoms  Bluford FORBES Zoe DELENA Karon DELENA, et al. Surgery versus conservative management of stable thoracolumbar fracture: the PRESTO feasibility RCT. [place] ([place]): [employer]; 2021 Nov. [facility] Technology Assessment, No. 25.62.) Appendix 3, Oswestry Disability Index category descriptors. Available from: Findjewelers.cz  Minimally Clinically Important Difference (MCID) = 12.8%   THE PATIENT SPECIFIC FUNCTIONAL SCALE  Place score of 0-10 (0 = unable to perform activity and 10 = able to perform activity at the same level as before injury or problem)  Activity Date: 08/09/24    Walk 5    2. Exercise 1    3.  Get up from chair 6    4. Climb up hill/bank area 1    5. Stairs 4    Total Score 17      Total Score = Sum of activity scores/number of activities  Minimally Detectable Change: 3 points (for single activity); 2 points (for average score)  [facility] (nd). The Patient Specific Functional Scale . Retrieved from [email or web address]   COGNITION: Overall cognitive status: Within functional limits for tasks assessed     SENSATION: WFL   POSTURE: forward head  PALPATION: S  curve thoracic shift L, lumbar R   LUMBAR ROM:   AROM eval  Flexion Nil loss  Extension 75% loss, R thoracic cavitation, L low back pain  Right lateral flexion 75% loss, bilateral hip pain  Left lateral flexion 50% loss, bilateral hip pain  Right rotation   Left rotation    (Blank rows = not tested)  LOWER EXTREMITY ROM:   LE ROM WNL  Active  Right eval Left eval  Hip flexion    Hip extension    Hip abduction    Hip adduction    Hip internal rotation    Hip external rotation    Knee flexion    Knee extension    Ankle dorsiflexion    Ankle plantarflexion    Ankle inversion    Ankle eversion     (Blank rows = not tested)  LOWER EXTREMITY MMT:    MMT Right eval Left eval  Hip flexion 4/5 4-/5 P in L anterior hip  Hip extension 4+/5 4/5  Hip abduction 4+/5 4+/5 P in L lateral hip  Hip adduction 5/5 5/5  Hip internal rotation    Hip external rotation    Knee flexion 4/5 4-/5  Knee extension 4/5 4-/5  Ankle dorsiflexion 5/5 5/5  Ankle plantarflexion 5/5 5/5  Ankle inversion    Ankle eversion     (Blank rows = not tested)   GAIT: Distance walked: lobby to treatment area Assistive device utilized: None Level of assistance: Complete Independence Comments: no major deficits noted  TREATMENT DATE:  [facility] Adult PT Treatment:                                                DATE: 08/09/24 Therapeutic Exercise: Repeated lumbar ext in standing x10, inc W baseline pain with left side glide in standing Repeated lumbar flexion in sitting x10: dec, better baseline pain with left side glide in standing 2nd set of 10 reps: dec, better  Education and HEP    PATIENT EDUCATION:  Education details: Pt educated on relevant anatomy, physiology, pathology, diagnosis, prognosis, progression of care, pain and activity modification related to low back  pain Person educated: Patient Education method: Explanation, Demonstration, and Handouts Education comprehension: verbalized understanding and returned demonstration  HOME EXERCISE PROGRAM: Access Code: [email or web address] URL: [email or web address] Date: 08/09/2024 Prepared by: Stann Ohara  Exercises - Seated Flexion Stretch  - 5 x daily - 7 x weekly - 1 sets - 10 reps - 2 hold  ASSESSMENT:  CLINICAL IMPRESSION: Patient is a 68 y.o. F who was seen today for physical therapy evaluation and treatment for low back pain. Pt has been limited in function and activity tolerance as a result. Pt has hx of scoliosis and likely contributes to the feeling of muscle imbalance. Pt symptoms are consistent with impingement of the lumbar spine with directional preference of repeated flexion in sitting x10. Pt stands to benefit from continued skilled physical therapy to address deficit areas and restore safety with activities and participations at home and in the community.    OBJECTIVE IMPAIRMENTS: decreased activity tolerance, decreased endurance, decreased mobility, decreased ROM, decreased strength, increased fascial restrictions, impaired perceived functional ability, increased muscle spasms, and pain.   ACTIVITY LIMITATIONS: carrying, lifting, sitting, standing, squatting, and stairs  PARTICIPATION LIMITATIONS: cleaning, laundry, community activity, occupation, and yard work  PERSONAL FACTORS: 1-2 comorbidities: Hx lumbar fusion and osteoporosis are also affecting patient's functional outcome.   REHAB POTENTIAL: Good  CLINICAL DECISION MAKING: Stable/uncomplicated  EVALUATION COMPLEXITY: Low   GOALS: Goals reviewed with patient? Yes  SHORT TERM GOALS: Target date: 09/13/24   Pt will report compliance with HEP to work towards ind and home management strategies Baseline: Goal status: INITIAL   2.  Pt will score no greater than 15/50 on ODI to demonstrate improved activity  tolerance Baseline: 25/50 Goal status: INITIAL   3.  Pt will improve lumbar spine ROM to full and painless in order to demonstrate progress towards activity tolerance and improved function Baseline: see ROM chart Goal status: INITIAL   4.  Pt will score no less than 40/50 on PSFS Baseline: 17/50 Goal status: INITIAL     LONG TERM GOALS: Target date: 10/18/24   Pt will score no greater than 5/50 on ODI to demonstrate improved activity tolerance Baseline: 25/50 Goal status: INITIAL   2.  Pt will report no greater than 1/10 pain over 7 consecutive days to demonstrate maintained reduction in symptoms and improved tolerance to activity Baseline: 1/10-8/10 Goal status: INITIAL   3.  Pt will be ind in the management of their symptoms at home and in the community Baseline:  Goal status: INITIAL     PLAN:  PT FREQUENCY: 1-2x/week  PT DURATION: other: 9 weeks  PLANNED INTERVENTIONS: 97110-Therapeutic exercises, 97530- Therapeutic activity, [id number]- Neuromuscular re-education, 97535- Self Care, [id number]- Manual therapy, [id number]- Electrical stimulation (unattended), 20560 (1-2 muscles), 20561 (3+ muscles)- Dry Needling,  Patient/Family education, Cryotherapy, and Moist heat.  PLAN FOR NEXT SESSION: Reassess response to repeated end range joint movement testing, progress strength, stability, endurance, motor control to lower quarter through functional movement patterns   Stann DELENA Ohara, PT 08/09/2024, 4:03 PM  Date of referral: 07/26/24 Referring provider: Emil Aloysius Schaumann, MD Referring diagnosis? Back injury, s39.92XA, musculoskeletal pain M78.18 Treatment diagnosis? (if different than referring diagnosis) m54.59  What was this (referring dx) caused by? Felton Hawks of Condition: Initial Onset (within last 3 months)   Laterality: Both  Current Functional Measure Score: Back Index 25/50  Objective measurements identify impairments when they are compared to normal values, the  uninvolved extremity, and prior level of function.  [x]  Yes  []  No  Objective assessment of functional ability: Moderate functional limitations   Briefly describe symptoms: ache, burning  How did symptoms start: fell outside landing on anterior trunk  Average pain intensity:  Last 24 hours: 2/10  Past week: 4/10  How often does the pt experience symptoms? Constantly  How much have the symptoms interfered with usual daily activities? Moderately  How has condition changed since care began at this facility? NA - initial visit  In general, how is the patients overall health? Excellent   BACK PAIN (STarT Back Screening Tool) No

## 2024-08-25 ENCOUNTER — Ambulatory Visit: Admitting: Physical Therapy

## 2024-08-25 ENCOUNTER — Encounter: Payer: Self-pay | Admitting: Physical Therapy

## 2024-08-25 DIAGNOSIS — M5459 Other low back pain: Secondary | ICD-10-CM | POA: Insufficient documentation

## 2024-08-25 NOTE — Therapy (Signed)
 OUTPATIENT PHYSICAL THERAPY THORACOLUMBAR TREATMENT   Patient Name: Teresa Fitzgerald MRN: [id number] DOB:1955-11-04, 68 y.o., female Today's Date: 08/25/2024  END OF SESSION:  PT End of Session - 08/25/24 1507     Visit Number 2    Number of Visits 10    Date for Recertification  10/18/24    Authorization Type [employer] Medicare    PT Start Time 1450    PT Stop Time 1530    PT Time Calculation (min) 40 min    Activity Tolerance Patient tolerated treatment well    Behavior During Therapy [facility] for tasks assessed/performed           Past Medical History:  Diagnosis Date   Allergic rhinitis    Allergy    [employer] 10/20/2020   Asthma    onset childhood; avoids trigger; Wert consultatoin in 2014. Dulera prescribed.   Asthma    [employer] 10/20/2020   Hypercholesteremia    Hyperlipidemia    [employer] 10/20/2020   Mitral valve prolapse    Osteopenia    SVT (supraventricular tachycardia)    presumed atrial tachycardia not inducible on EPS 12/11   Past Surgical History:  Procedure Laterality Date   EP study  12/11   by JA, no inducible arrhythmia   LUMBAR FUSION  2000   SPINE SURGERY N/A    [employer] 10/20/2020   TUBAL LIGATION  1988   Patient Active Problem List   Diagnosis Date Noted   Musculoskeletal pain 07/26/2024   Back injury, initial encounter 07/26/2024   History of osteoporosis 07/26/2024   Dyslipidemia 04/22/2021   Age-related osteoporosis without current pathological fracture 10/21/2018   Seasonal allergic rhinitis due to pollen 04/11/2017   Other nonrheumatic mitral valve disorders 01/08/2016   SVT/ PSVT/ PAT 12/28/2009   Asthma 12/27/2009   HYPERCHOLESTEROLEMIA 10/26/2009    PCP: Emil Aloysius Schaumann, MD  REFERRING PROVIDER: Emil Aloysius Schaumann, MD  REFERRING DIAG: [id number] (ICD-10-CM) - Back injury, initial encounter   M79.18 (ICD-10-CM) - Musculoskeletal pain  Rationale for Evaluation and Treatment: Rehabilitation  THERAPY DIAG:  Other low back  pain  ONSET DATE: October 2025  SUBJECTIVE:                                                                                                                                                                                           SUBJECTIVE STATEMENT: Pt states that she has been having improvement in symptoms, notes that symptoms are still present with reaching and with prolonged sitting.   Pt states that in the beginning of October, she fell and hurt her shoulder and her low back while walking down  an embankment, things improved and she feels she may have done too much and had a flare up. Went to doctor who ruled out new pathology. Overall symptoms have been improving, described as dull ache. She has tried heat/cold/KT tape. 1/10-8/10 range depending on time of day, worse in evenings or at the end of the day. Pt denies radicular symptoms. Notes that low back pain is more on the left side. Anything elongating the left side will inc symptoms. Reports hx of scoliosis.  PERTINENT HISTORY:  Back injury, initial encounter - Primary   Persistent pain affecting quality of life However clinically stable with no red flag signs or symptoms. Recommend x-rays today.  Will review images when available Pain management discussed Differential diagnosis discussed Recommend Tylenol for mild to moderate pain and tramadol  for moderate to severe pain    PAIN:  Are you having pain? Yes: NPRS scale: 4-6/10 Pain location: L low back  Pain description: ache/burn Aggravating factors: reaching overhead, stretching, end of day, sitting up straight  Relieving factors: laying flat, bending forward  PRECAUTIONS: None  RED FLAGS: None   WEIGHT BEARING RESTRICTIONS: No  FALLS:  Has patient fallen in last 6 months? Yes. 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  DIAGNOSTIC FINDINGS:  FINDINGS: No acute fracture or subluxation of the thoracic spine. The bones are osteopenic. Multilevel degenerative changes. The soft tissues are unremarkable   IMPRESSION: No acute findings. Multilevel degenerative changes.  FINDINGS: No acute fracture or subluxation of the lumbar spine. Evaluation is however limited due to osteopenia, degenerative changes, and scoliosis. L2-L3 and L3-L4 ray cages noted. The soft tissues are unremarkable.   IMPRESSION: 1. No acute fracture or subluxation. 2. Degenerative changes and scoliosis.  PATIENT SURVEYS:  Modified Oswestry:  MODIFIED OSWESTRY DISABILITY SCALE  Date: 08/09/24 Score  Pain intensity 1 = The pain is bad, but I can manage without having to take pain medication  2. Personal care (washing, dressing, etc.) 2 =  It is painful to take care of myself, and I am slow and careful.  3. Lifting 3 = Pain prevents me from lifting heavy weights, but I can manage light to medium weights if they are conveniently positioned  4. Walking 2 =  Pain prevents me from walking more than  mile.  5. Sitting 3 =  Pain prevents me from sitting more than  hour.  6. Standing 4 =  Pain prevents me from standing more than 10 minutes.  7. Sleeping 1 = I can sleep well only by using pain medication.  8. Social Life 3 =  Pain prevents me from going out very often.  9. Traveling 4 = My pain restricts my travel to short necessary journeys under 1/2 hour.  10. Employment/ Homemaking 2 = I can perform most of my homemaking/job duties, but pain prevents me from performing more physically stressful activities (eg, lifting, vacuuming).  Total 25/50   Interpretation of scores: Score Category Description  0-20% Minimal  Disability The patient can cope with most living activities. Usually no treatment is indicated apart from advice on lifting, sitting and exercise  21-40% Moderate Disability The patient experiences more pain  and difficulty with sitting, lifting and standing. Travel and social life are more difficult and they may be disabled from work. Personal care, sexual activity and sleeping are not grossly affected, and the patient can usually be managed by conservative means  41-60% Severe Disability Pain remains the main problem in this group, but activities of daily living are affected. These patients require a detailed investigation  61-80% Crippled Back pain impinges on all aspects of the patient's life. Positive intervention is required  81-100% Bed-bound These patients are either bed-bound or exaggerating their symptoms  Bluford FORBES Zoe DELENA Karon DELENA, et al. Surgery versus conservative management of stable thoracolumbar fracture: the PRESTO feasibility RCT. [place] ([place]): [employer]; 2021 Nov. [facility] [employer] Assessment, No. 25.62.) Appendix 3, Oswestry Disability Index category descriptors. Available from: Findjewelers.cz  Minimally Clinically Important Difference (MCID) = 12.8%   THE PATIENT SPECIFIC FUNCTIONAL SCALE  Place score of 0-10 (0 = unable to perform activity and 10 = able to perform activity at the same level as before injury or problem)  Activity Date: 08/09/24    Walk 5    2. Exercise 1    3.  Get up from chair 6    4. Climb up hill/bank area 1    5. Stairs 4    Total Score 17      Total Score = Sum of activity scores/number of activities  Minimally Detectable Change: 3 points (for single activity); 2 points (for average score)  [facility] (nd). The Patient Specific Functional Scale . Retrieved from [email or web address]   COGNITION: Overall cognitive status: Within  functional limits for tasks assessed     SENSATION: WFL   POSTURE: forward head  PALPATION: S curve thoracic shift L, lumbar R   LUMBAR ROM: baseline flexion is palms on floor  AROM eval 08/25/24  Flexion Nil loss 25% loss, end range pain, touches floor with fingertips  Extension 75% loss, R thoracic cavitation, L low back pain 25% loss, inc anterior hip pull, L>R  Right lateral flexion 75% loss, bilateral hip pain 50% loss, low back pain and L hip pain  Left lateral flexion 50% loss, bilateral hip pain 25% loss, slight discomfort in the hip   Right rotation    Left rotation     (Blank rows = not tested)  LOWER EXTREMITY ROM:   LE ROM WNL  Active  Right eval Left eval  Hip flexion    Hip extension    Hip abduction    Hip adduction    Hip internal rotation    Hip external rotation    Knee flexion    Knee extension    Ankle dorsiflexion    Ankle plantarflexion    Ankle inversion    Ankle eversion     (Blank rows = not tested)  LOWER EXTREMITY MMT:    MMT Right eval Left eval  Hip flexion 4/5 4-/5 P in L anterior hip  Hip extension 4+/5 4/5  Hip abduction 4+/5 4+/5 P in L lateral hip  Hip adduction 5/5 5/5  Hip internal rotation    Hip external rotation    Knee flexion 4/5 4-/5  Knee extension 4/5 4-/5  Ankle dorsiflexion 5/5 5/5  Ankle plantarflexion 5/5 5/5  Ankle inversion    Ankle eversion     (Blank rows = not tested)   GAIT: Distance walked: lobby to treatment area Assistive device utilized: None Level of assistance: Complete Independence Comments: no major deficits noted  TREATMENT  [facility] Adult PT Treatment:                                                DATE: 08/25/24 Therapeutic Exercise: Repeated flexion in sitting x10: dec, better with baseline of anterior hip pain bil with lumbar extension in standing Supine physioball press x30, 5 sec hold, demonstrates in sitting Seated W stretch with physioball x15 cycles, pt has  paraesthesia with anterior roll and this portion held in HEP Paloff press x30 bilaterally with black TB                                                                                                               [facility] Adult PT Treatment:                                                DATE: 08/09/24 Therapeutic Exercise: Repeated lumbar ext in standing x10, inc W baseline pain with left side glide in standing Repeated lumbar flexion in sitting x10: dec, better baseline pain with left side glide in standing 2nd set of 10 reps: dec, better  Education and HEP    PATIENT EDUCATION:  Education details: Pt educated on relevant anatomy, physiology, pathology, diagnosis, prognosis, progression of care, pain and activity modification related to low back pain Person educated: Patient Education method: Explanation, Demonstration, and Handouts Education comprehension: verbalized understanding and returned demonstration  HOME EXERCISE PROGRAM: Access Code: [email or web address] URL: [email or web address] Date: 08/26/2024 Prepared by: Stann Ohara  Exercises - Seated Flexion Stretch  - 5 x daily - 7 x weekly - 1 sets - 10 reps - 2 hold - Seated Abdominal Press into Swiss Ball  - 1 x daily - 7 x weekly - 2 sets - 15 reps - 2 hold - Seated 3 Way Exercise Ball Roll Out Stretch  - 1 x daily - 7 x weekly - 1 sets - 15 reps - 2-3s hold - Standing Anti-Rotation Press with Anchored Resistance  - 1 x daily - 7 x weekly - 1 sets - 30 reps - 2 hold  ASSESSMENT:  CLINICAL IMPRESSION: Pt has been responding well to her current regimen, directional preference of repeated flexion in sitting confirmed. Added strength, stability, endurance interventions today with revision of HEP. Pt stands to benefit from continued skilled physical therapy to address deficit areas and restore safety with activities and participations at home and in the community.    Patient is a 68 y.o. F who was seen today for physical  therapy evaluation and treatment for low back pain. Pt has been limited in function and activity tolerance as a result. Pt has hx of scoliosis and likely contributes to  the feeling of muscle imbalance. Pt symptoms are consistent with impingement of the lumbar spine with directional preference of repeated flexion in sitting x10. Pt stands to benefit from continued skilled physical therapy to address deficit areas and restore safety with activities and participations at home and in the community.    OBJECTIVE IMPAIRMENTS: decreased activity tolerance, decreased endurance, decreased mobility, decreased ROM, decreased strength, increased fascial restrictions, impaired perceived functional ability, increased muscle spasms, and pain.   ACTIVITY LIMITATIONS: carrying, lifting, sitting, standing, squatting, and stairs  PARTICIPATION LIMITATIONS: cleaning, laundry, community activity, occupation, and yard work  PERSONAL FACTORS: 1-2 comorbidities: Hx lumbar fusion and osteoporosis are also affecting patient's functional outcome.   REHAB POTENTIAL: Good  CLINICAL DECISION MAKING: Stable/uncomplicated  EVALUATION COMPLEXITY: Low   GOALS: Goals reviewed with patient? Yes  SHORT TERM GOALS: Target date: 09/13/24   Pt will report compliance with HEP to work towards ind and home management strategies Baseline: Goal status: INITIAL   2.  Pt will score no greater than 15/50 on ODI to demonstrate improved activity tolerance Baseline: 25/50 Goal status: INITIAL   3.  Pt will improve lumbar spine ROM to full and painless in order to demonstrate progress towards activity tolerance and improved function Baseline: see ROM chart Goal status: INITIAL   4.  Pt will score no less than 40/50 on PSFS Baseline: 17/50 Goal status: INITIAL     LONG TERM GOALS: Target date: 10/18/24   Pt will score no greater than 5/50 on ODI to demonstrate improved activity tolerance Baseline: 25/50 Goal status: INITIAL    2.  Pt will report no greater than 1/10 pain over 7 consecutive days to demonstrate maintained reduction in symptoms and improved tolerance to activity Baseline: 1/10-8/10 Goal status: INITIAL   3.  Pt will be ind in the management of their symptoms at home and in the community Baseline:  Goal status: INITIAL     PLAN:  PT FREQUENCY: 1-2x/week  PT DURATION: other: 9 weeks  PLANNED INTERVENTIONS: 97110-Therapeutic exercises, 97530- Therapeutic activity, [id number]- Neuromuscular re-education, 97535- Self Care, 02859- Manual therapy, [id number]- Electrical stimulation (unattended), 20560 (1-2 muscles), 20561 (3+ muscles)- Dry Needling, Patient/Family education, Cryotherapy, and Moist heat.  PLAN FOR NEXT SESSION: Reassess response to repeated end range joint movement testing, progress strength, stability, endurance, motor control to lower quarter through functional movement patterns   Stann DELENA Ohara, PT 08/25/2024, 11:13 PM  Date of referral: 07/26/24 Referring provider: Emil Aloysius Schaumann, MD Referring diagnosis? Back injury, s39.92XA, musculoskeletal pain M78.18 Treatment diagnosis? (if different than referring diagnosis) m54.59  What was this (referring dx) caused by? Felton Hawks of Condition: Initial Onset (within last 3 months)   Laterality: Both  Current Functional Measure Score: Back Index 25/50  Objective measurements identify impairments when they are compared to normal values, the uninvolved extremity, and prior level of function.  [x]  Yes  []  No  Objective assessment of functional ability: Moderate functional limitations   Briefly describe symptoms: ache, burning  How did symptoms start: fell outside landing on anterior trunk  Average pain intensity:  Last 24 hours: 2/10  Past week: 4/10  How often does the pt experience symptoms? Constantly  How much have the symptoms interfered with usual daily activities? Moderately  How has condition changed since care  began at this facility? NA - initial visit  In general, how is the patients overall health? Excellent   BACK PAIN (STarT Back Screening Tool) No

## 2024-09-01 ENCOUNTER — Encounter: Payer: Self-pay | Admitting: Physical Therapy

## 2024-09-01 ENCOUNTER — Ambulatory Visit: Admitting: Physical Therapy

## 2024-09-01 DIAGNOSIS — M5459 Other low back pain: Secondary | ICD-10-CM

## 2024-09-01 NOTE — Therapy (Signed)
 OUTPATIENT PHYSICAL THERAPY THORACOLUMBAR TREATMENT   Patient Name: ITALI MCKENDRY MRN: [id number] DOB:1956-08-31, 68 y.o., female Today's Date: 09/01/2024  END OF SESSION:  PT End of Session - 09/01/24 1521     Visit Number 3    Number of Visits 10    Date for Recertification  10/18/24    Authorization Type [employer] Medicare    Authorization Time Period 08/09/24-10/04/24    Authorization - Visit Number 3    Authorization - Number of Visits 6    PT Start Time 0245    PT Stop Time 0323    PT Time Calculation (min) 38 min            Past Medical History:  Diagnosis Date   Allergic rhinitis    Allergy    [employer] 10/20/2020   Asthma    onset childhood; avoids trigger; Wert consultatoin in 2014. Dulera prescribed.   Asthma    [employer] 10/20/2020   Hypercholesteremia    Hyperlipidemia    [employer] 10/20/2020   Mitral valve prolapse    Osteopenia    SVT (supraventricular tachycardia)    presumed atrial tachycardia not inducible on EPS 12/11   Past Surgical History:  Procedure Laterality Date   EP study  12/11   by JA, no inducible arrhythmia   LUMBAR FUSION  2000   SPINE SURGERY N/A    [employer] 10/20/2020   TUBAL LIGATION  1988   Patient Active Problem List   Diagnosis Date Noted   Musculoskeletal pain 07/26/2024   Back injury, initial encounter 07/26/2024   History of osteoporosis 07/26/2024   Dyslipidemia 04/22/2021   Age-related osteoporosis without current pathological fracture 10/21/2018   Seasonal allergic rhinitis due to pollen 04/11/2017   Other nonrheumatic mitral valve disorders 01/08/2016   SVT/ PSVT/ PAT 12/28/2009   Asthma 12/27/2009   HYPERCHOLESTEROLEMIA 10/26/2009    PCP: Emil Aloysius Schaumann, MD  REFERRING PROVIDER: Emil Aloysius Schaumann, MD  REFERRING DIAG: [phone] (ICD-10-CM) - Back injury, initial encounter   M79.18 (ICD-10-CM) - Musculoskeletal pain  Rationale for Evaluation and Treatment: Rehabilitation  THERAPY DIAG:  Other low back  pain  ONSET DATE: October 2025  SUBJECTIVE:                                                                                                                                                                                           SUBJECTIVE STATEMENT: Pt reports she now has an exercise ball. Back is achy today after 4 hours of christmas shopping yesterday. Points to left lumbar area for pain.   Pt states that in the beginning of October, she fell and  hurt her shoulder and her low back while walking down an embankment, things improved and she feels she may have done too much and had a flare up. Went to doctor who ruled out new pathology. Overall symptoms have been improving, described as dull ache. She has tried heat/cold/KT tape. 1/10-8/10 range depending on time of day, worse in evenings or at the end of the day. Pt denies radicular symptoms. Notes that low back pain is more on the left side. Anything elongating the left side will inc symptoms. Reports hx of scoliosis.  PERTINENT HISTORY:  Back injury, initial encounter - Primary   Persistent pain affecting quality of life However clinically stable with no red flag signs or symptoms. Recommend x-rays today.  Will review images when available Pain management discussed Differential diagnosis discussed Recommend Tylenol for mild to moderate pain and tramadol  for moderate to severe pain    PAIN:  Are you having pain? Yes: NPRS scale: 4-6/10 Pain location: L low back  Pain description: ache/burn Aggravating factors: reaching overhead, stretching, end of day, sitting up straight  Relieving factors: laying flat, bending forward  PRECAUTIONS: None  RED FLAGS: None   WEIGHT BEARING RESTRICTIONS: No  FALLS:  Has patient fallen in last 6 months? Yes. 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  DIAGNOSTIC FINDINGS:  FINDINGS: No acute fracture or subluxation of the thoracic spine. The bones are osteopenic. Multilevel degenerative changes. The soft tissues are unremarkable   IMPRESSION: No acute findings. Multilevel degenerative changes.  FINDINGS: No acute fracture or subluxation of the lumbar spine. Evaluation is however limited due to osteopenia, degenerative changes, and scoliosis. L2-L3 and L3-L4 ray cages noted. The soft tissues are unremarkable.   IMPRESSION: 1. No acute fracture or subluxation. 2. Degenerative changes and scoliosis.  PATIENT SURVEYS:  Modified Oswestry:  MODIFIED OSWESTRY DISABILITY SCALE  Date: 08/09/24 Score  Pain intensity 1 = The pain is bad, but I can manage without having to take pain medication  2. Personal care (washing, dressing, etc.) 2 =  It is painful to take care of myself, and I am slow and careful.  3. Lifting 3 = Pain prevents me from lifting heavy weights, but I can manage light to medium weights if they are conveniently positioned  4. Walking 2 =  Pain prevents me from walking more than  mile.  5. Sitting 3 =  Pain prevents me from sitting more than  hour.  6. Standing 4 =  Pain prevents me from standing more than 10 minutes.  7. Sleeping 1 = I can sleep well only by using pain medication.  8. Social Life 3 =  Pain prevents me from going out very often.  9. Traveling 4 = My pain restricts my travel to short necessary journeys under 1/2 hour.  10. Employment/ Homemaking 2 = I can perform most of my homemaking/job duties, but pain prevents me from performing more physically stressful activities (eg, lifting, vacuuming).  Total 25/50   Interpretation of scores: Score Category Description  0-20%  Minimal Disability The patient can cope with most living activities. Usually no treatment is indicated apart from advice on lifting, sitting and  exercise  21-40% Moderate Disability The patient experiences more pain and difficulty with sitting, lifting and standing. Travel and social life are more difficult and they may be disabled from work. Personal care, sexual activity and sleeping are not grossly affected, and the patient can usually be managed by conservative means  41-60% Severe Disability Pain remains the main problem in this group, but activities of daily living are affected. These patients require a detailed investigation  61-80% Crippled Back pain impinges on all aspects of the patients life. Positive intervention is required  81-100% Bed-bound These patients are either bed-bound or exaggerating their symptoms  Bluford FORBES Zoe DELENA Karon DELENA, et al. Surgery versus conservative management of stable thoracolumbar fracture: the PRESTO feasibility RCT. [place] ([place]): [employer]; 2021 Nov. [facility] Technology Assessment, No. 25.62.) Appendix 3, Oswestry Disability Index category descriptors. Available from: Findjewelers.cz  Minimally Clinically Important Difference (MCID) = 12.8%   THE PATIENT SPECIFIC FUNCTIONAL SCALE  Place score of 0-10 (0 = unable to perform activity and 10 = able to perform activity at the same level as before injury or problem)  Activity Date: 08/09/24    Walk 5    2. Exercise 1    3.  Get up from chair 6    4. Climb up hill/bank area 1    5. Stairs 4    Total Score 17      Total Score = Sum of activity scores/number of activities  Minimally Detectable Change: 3 points (for single activity); 2 points (for average score)  [facility] (nd). The Patient Specific Functional Scale . Retrieved from [email or web address]   COGNITION: Overall cognitive status:  Within functional limits for tasks assessed     SENSATION: WFL   POSTURE: forward head  PALPATION: S curve thoracic shift L, lumbar R   LUMBAR ROM: baseline flexion is palms on floor  AROM eval 08/25/24  Flexion Nil loss 25% loss, end range pain, touches floor with fingertips  Extension 75% loss, R thoracic cavitation, L low back pain 25% loss, inc anterior hip pull, L>R  Right lateral flexion 75% loss, bilateral hip pain 50% loss, low back pain and L hip pain  Left lateral flexion 50% loss, bilateral hip pain 25% loss, slight discomfort in the hip   Right rotation    Left rotation     (Blank rows = not tested)  LOWER EXTREMITY ROM:   LE ROM WNL  Active  Right eval Left eval  Hip flexion    Hip extension    Hip abduction    Hip adduction    Hip internal rotation    Hip external rotation    Knee flexion    Knee extension    Ankle dorsiflexion    Ankle plantarflexion    Ankle inversion    Ankle eversion     (Blank rows = not tested)  LOWER EXTREMITY MMT:    MMT Right eval Left eval  Hip flexion 4/5 4-/5 P in L anterior hip  Hip extension 4+/5 4/5  Hip abduction 4+/5 4+/5 P in L lateral hip  Hip adduction 5/5 5/5  Hip internal rotation    Hip external rotation    Knee flexion 4/5 4-/5  Knee extension 4/5 4-/5  Ankle dorsiflexion 5/5 5/5  Ankle plantarflexion 5/5 5/5  Ankle inversion    Ankle eversion     (Blank rows = not tested)   GAIT: Distance walked: lobby to treatment area Assistive device utilized: None Level of  assistance: Complete Independence Comments: no major deficits noted  TREATMENT DATE:                [facility] Adult PT Treatment:                                                DATE: 09/01/24 Therapeutic Exercise: Repeated flexion in sitting x10: LTR  Supine marching Supine physioball press 10x3, 5 sec hold Palloff press 10 x 2 each black band  Standing Row Blue Band 10 x 2  Standing pull downs Blue band 10 x 2  Seated physioball  rollouts laterally  Updated HEP      [facility] Adult PT Treatment:                                                DATE: 08/25/24 Therapeutic Exercise: Repeated flexion in sitting x10: dec, better with baseline of anterior hip pain bil with lumbar extension in standing Supine physioball press x30, 5 sec hold, demonstrates in sitting Seated W stretch with physioball x15 cycles, pt has paraesthesia with anterior roll and this portion held in HEP Paloff press x30 bilaterally with black TB                                                                                                               [facility] Adult PT Treatment:                                                DATE: 08/09/24 Therapeutic Exercise: Repeated lumbar ext in standing x10, inc W baseline pain with left side glide in standing Repeated lumbar flexion in sitting x10: dec, better baseline pain with left side glide in standing 2nd set of 10 reps: dec, better  Education and HEP    PATIENT EDUCATION:  Education details: Pt educated on relevant anatomy, physiology, pathology, diagnosis, prognosis, progression of care, pain and activity modification related to low back pain Person educated: Patient Education method: Explanation, Demonstration, and Handouts Education comprehension: verbalized understanding and returned demonstration  HOME EXERCISE PROGRAM: Access Code: [email or web address] URL: [email or web address] Date: 08/26/2024 Prepared by: Stann Ohara  Exercises - Seated Flexion Stretch  - 5 x daily - 7 x weekly - 1 sets - 10 reps - 2 hold - Seated Abdominal Press into Swiss Ball  - 1 x daily - 7 x weekly - 2 sets - 15 reps - 2 hold - Seated 3 Way Exercise Ball Roll Out Stretch  - 1 x daily - 7 x weekly - 1 sets - 15 reps - 2-3s hold - Standing Anti-Rotation Press with Anchored Resistance  - 1  x daily - 7 x weekly - 1 sets - 30 reps - 2 hold - Standing Row with Anchored Resistance  - 1 x daily - 7 x weekly - 3 sets -  10 reps - Shoulder extension with resistance - Neutral  - 1 x daily - 7 x weekly - 3 sets - 10 reps  ASSESSMENT:  CLINICAL IMPRESSION: Pt has been responding well to her current regimen, directional preference of repeated flexion in sitting confirmed. Added strength, stability, endurance interventions today with revision of HEP. Pt stands to benefit from continued skilled physical therapy to address deficit areas and restore safety with activities and participations at home and in the community.    Patient is a 68 y.o. F who was seen today for physical therapy evaluation and treatment for low back pain. Pt has been limited in function and activity tolerance as a result. Pt has hx of scoliosis and likely contributes to the feeling of muscle imbalance. Pt symptoms are consistent with impingement of the lumbar spine with directional preference of repeated flexion in sitting x10. Pt stands to benefit from continued skilled physical therapy to address deficit areas and restore safety with activities and participations at home and in the community.    OBJECTIVE IMPAIRMENTS: decreased activity tolerance, decreased endurance, decreased mobility, decreased ROM, decreased strength, increased fascial restrictions, impaired perceived functional ability, increased muscle spasms, and pain.   ACTIVITY LIMITATIONS: carrying, lifting, sitting, standing, squatting, and stairs  PARTICIPATION LIMITATIONS: cleaning, laundry, community activity, occupation, and yard work  PERSONAL FACTORS: 1-2 comorbidities: Hx lumbar fusion and osteoporosis are also affecting patient's functional outcome.   REHAB POTENTIAL: Good  CLINICAL DECISION MAKING: Stable/uncomplicated  EVALUATION COMPLEXITY: Low   GOALS: Goals reviewed with patient? Yes  SHORT TERM GOALS: Target date: 09/13/24   Pt will report compliance with HEP to work towards ind and home management strategies Baseline: Goal status: INITIAL   2.  Pt will  score no greater than 15/50 on ODI to demonstrate improved activity tolerance Baseline: 25/50 Goal status: INITIAL   3.  Pt will improve lumbar spine ROM to full and painless in order to demonstrate progress towards activity tolerance and improved function Baseline: see ROM chart Goal status: INITIAL   4.  Pt will score no less than 40/50 on PSFS Baseline: 17/50 Goal status: INITIAL     LONG TERM GOALS: Target date: 10/18/24   Pt will score no greater than 5/50 on ODI to demonstrate improved activity tolerance Baseline: 25/50 Goal status: INITIAL   2.  Pt will report no greater than 1/10 pain over 7 consecutive days to demonstrate maintained reduction in symptoms and improved tolerance to activity Baseline: 1/10-8/10 Goal status: INITIAL   3.  Pt will be ind in the management of their symptoms at home and in the community Baseline:  Goal status: INITIAL     PLAN:  PT FREQUENCY: 1-2x/week  PT DURATION: other: 9 weeks  PLANNED INTERVENTIONS: 97110-Therapeutic exercises, 97530- Therapeutic activity, [id number]- Neuromuscular re-education, 97535- Self Care, 02859- Manual therapy, [id number]- Electrical stimulation (unattended), 20560 (1-2 muscles), 20561 (3+ muscles)- Dry Needling, Patient/Family education, Cryotherapy, and Moist heat.  PLAN FOR NEXT SESSION: Reassess response to repeated end range joint movement testing, progress strength, stability, endurance, motor control to lower quarter through functional movement patterns   Harlene Persons, PTA 09/01/2024 3:24 PM Phone: [phone] Fax: [phone]   Date of referral: 07/26/24 Referring provider: Emil Aloysius Schaumann, MD Referring diagnosis? Back injury, s39.92XA, musculoskeletal pain M78.18 Treatment diagnosis? (if  different than referring diagnosis) m54.59  What was this (referring dx) caused by? Felton Hawks of Condition: Initial Onset (within last 3 months)   Laterality: Both  Current Functional Measure Score: Back  Index 25/50  Objective measurements identify impairments when they are compared to normal values, the uninvolved extremity, and prior level of function.  [x]  Yes  []  No  Objective assessment of functional ability: Moderate functional limitations   Briefly describe symptoms: ache, burning  How did symptoms start: fell outside landing on anterior trunk  Average pain intensity:  Last 24 hours: 2/10  Past week: 4/10  How often does the pt experience symptoms? Constantly  How much have the symptoms interfered with usual daily activities? Moderately  How has condition changed since care began at this facility? NA - initial visit  In general, how is the patients overall health? Excellent   BACK PAIN (STarT Back Screening Tool) No

## 2024-09-08 ENCOUNTER — Ambulatory Visit: Admitting: Physical Therapy

## 2024-09-08 ENCOUNTER — Encounter: Payer: Self-pay | Admitting: Physical Therapy

## 2024-09-08 DIAGNOSIS — M5459 Other low back pain: Secondary | ICD-10-CM

## 2024-09-08 NOTE — Therapy (Signed)
 OUTPATIENT PHYSICAL THERAPY THORACOLUMBAR TREATMENT   Patient Name: Teresa Fitzgerald MRN: [id number] DOB:[birth year], 68 y.o., female Today's Date: 09/08/2024  END OF SESSION:  PT End of Session - 09/08/24 1444     Visit Number 4    Number of Visits 10    Date for Recertification  10/18/24    Authorization Type [employer]    Authorization Time Period 08/09/24-10/04/24    Authorization - Visit Number 4    Authorization - Number of Visits 6    PT Start Time 0245    PT Stop Time 0330    PT Time Calculation (min) 45 min            Past Medical History:  Diagnosis Date   Allergic rhinitis    Allergy    [employer] 10/20/2020   Asthma    onset childhood; avoids trigger; Wert consultatoin in 2014. Dulera prescribed.   Asthma    [employer] 10/20/2020   Hypercholesteremia    Hyperlipidemia    [employer] 10/20/2020   Mitral valve prolapse    Osteopenia    SVT (supraventricular tachycardia)    presumed atrial tachycardia not inducible on EPS 12/11   Past Surgical History:  Procedure Laterality Date   EP study  12/11   by JA, no inducible arrhythmia   LUMBAR FUSION  2000   SPINE SURGERY N/A    [employer] 10/20/2020   TUBAL LIGATION  1988   Patient Active Problem List   Diagnosis Date Noted   Musculoskeletal pain 07/26/2024   Back injury, initial encounter 07/26/2024   History of osteoporosis 07/26/2024   Dyslipidemia 04/22/2021   Age-related osteoporosis without current pathological fracture 10/21/2018   Seasonal allergic rhinitis due to pollen 04/11/2017   Other nonrheumatic mitral valve disorders 01/08/2016   SVT/ PSVT/ PAT 12/28/2009   Asthma 12/27/2009   HYPERCHOLESTEROLEMIA 10/26/2009    PCP: Emil Aloysius Schaumann, MD  REFERRING PROVIDER: Emil Aloysius Schaumann, MD  REFERRING DIAG: [phone] (ICD-10-CM) - Back injury, initial encounter   M79.18 (ICD-10-CM) - Musculoskeletal pain  Rationale for Evaluation and Treatment: Rehabilitation  THERAPY DIAG:  Other low back  pain  ONSET DATE: October 2025  SUBJECTIVE:                                                                                                                                                                                           SUBJECTIVE STATEMENT: Pt reports stiffness on arrival. Reports compliance with HEP and increased pain with extra shopping for the holidays.   EVAL: Pt states that in the beginning of October, she fell and hurt her shoulder and her low back  while walking down an embankment, things improved and she feels she may have done too much and had a flare up. Went to doctor who ruled out new pathology. Overall symptoms have been improving, described as dull ache. She has tried heat/cold/KT tape. 1/10-8/10 range depending on time of day, worse in evenings or at the end of the day. Pt denies radicular symptoms. Notes that low back pain is more on the left side. Anything elongating the left side will inc symptoms. Reports hx of scoliosis.  PERTINENT HISTORY:  Back injury, initial encounter - Primary   Persistent pain affecting quality of life However clinically stable with no red flag signs or symptoms. Recommend x-rays today.  Will review images when available Pain management discussed Differential diagnosis discussed Recommend Tylenol for mild to moderate pain and tramadol  for moderate to severe pain    PAIN:  Are you having pain? Yes: NPRS scale: 4-6/10 Pain location: L low back  Pain description: ache/burn Aggravating factors: reaching overhead, stretching, end of day, sitting up straight  Relieving factors: laying flat, bending forward  PRECAUTIONS: None  RED FLAGS: None   WEIGHT BEARING RESTRICTIONS: No  FALLS:  Has patient fallen in last 6 months? Yes. 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  DIAGNOSTIC FINDINGS:  FINDINGS: No acute fracture or subluxation of the thoracic spine. The bones are osteopenic. Multilevel degenerative changes. The soft tissues are unremarkable   IMPRESSION: No acute findings. Multilevel degenerative changes.  FINDINGS: No acute fracture or subluxation of the lumbar spine. Evaluation is however limited due to osteopenia, degenerative changes, and scoliosis. L2-L3 and L3-L4 ray cages noted. The soft tissues are unremarkable.   IMPRESSION: 1. No acute fracture or subluxation. 2. Degenerative changes and scoliosis.  PATIENT SURVEYS:  Modified Oswestry:  MODIFIED OSWESTRY DISABILITY SCALE  Date: 08/09/24 Score  Pain intensity 1 = The pain is bad, but I can manage without having to take pain medication  2. Personal care (washing, dressing, etc.) 2 =  It is painful to take care of myself, and I am slow and careful.  3. Lifting 3 = Pain prevents me from lifting heavy weights, but I can manage light to medium weights if they are conveniently positioned  4. Walking 2 =  Pain prevents me from walking more than  mile.  5. Sitting 3 =  Pain prevents me from sitting more than  hour.  6. Standing 4 =  Pain prevents me from standing more than 10 minutes.  7. Sleeping 1 = I can sleep well only by using pain medication.  8. Social Life 3 =  Pain prevents me from going out very often.  9. Traveling 4 = My pain restricts my travel to short necessary journeys under 1/2 hour.  10. Employment/ Homemaking 2 = I can perform most of my homemaking/job duties, but pain prevents me from performing more physically stressful activities (eg, lifting, vacuuming).  Total 25/50   Interpretation of scores: Score Category Description  0-20% Minimal Disability The  patient can cope with most living activities. Usually no treatment is indicated apart from advice on lifting, sitting and exercise  21-40% Moderate Disability The patient  experiences more pain and difficulty with sitting, lifting and standing. Travel and social life are more difficult and they may be disabled from work. Personal care, sexual activity and sleeping are not grossly affected, and the patient can usually be managed by conservative means  41-60% Severe Disability Pain remains the main problem in this group, but activities of daily living are affected. These patients require a detailed investigation  61-80% Crippled Back pain impinges on all aspects of the patients life. Positive intervention is required  81-100% Bed-bound These patients are either bed-bound or exaggerating their symptoms  Bluford FORBES Zoe DELENA Karon DELENA, et al. Surgery versus conservative management of stable thoracolumbar fracture: the PRESTO feasibility RCT. [place] ([place]): [employer]; 2021 Nov. [facility] Technology Assessment, No. 25.62.) Appendix 3, Oswestry Disability Index category descriptors. Available from: Findjewelers.cz  Minimally Clinically Important Difference (MCID) = 12.8%   THE PATIENT SPECIFIC FUNCTIONAL SCALE  Place score of 0-10 (0 = unable to perform activity and 10 = able to perform activity at the same level as before injury or problem)  Activity Date: 08/09/24    Walk 5    2. Exercise 1    3.  Get up from chair 6    4. Climb up hill/bank area 1    5. Stairs 4    Total Score 17      Total Score = Sum of activity scores/number of activities  Minimally Detectable Change: 3 points (for single activity); 2 points (for average score)  [facility] (nd). The Patient Specific Functional Scale . Retrieved from [email or web address]   COGNITION: Overall cognitive status: Within functional  limits for tasks assessed     SENSATION: WFL   POSTURE: forward head  PALPATION: S curve thoracic shift L, lumbar R   LUMBAR ROM: baseline flexion is palms on floor  AROM eval 08/25/24 09/08/24  Flexion Nil loss 25% loss, end range pain, touches floor with fingertips 2 inches from floor , burning in hamstrings   Extension 75% loss, R thoracic cavitation, L low back pain 25% loss, inc anterior hip pull, L>R   Right lateral flexion 75% loss, bilateral hip pain 50% loss, low back pain and L hip pain 50%  Left low back pain  Left lateral flexion 50% loss, bilateral hip pain 25% loss, slight discomfort in the hip    Right rotation     Left rotation      (Blank rows = not tested)  LOWER EXTREMITY ROM:   LE ROM WNL  Active  Right eval Left eval  Hip flexion    Hip extension    Hip abduction    Hip adduction    Hip internal rotation    Hip external rotation    Knee flexion    Knee extension    Ankle dorsiflexion    Ankle plantarflexion    Ankle inversion    Ankle eversion     (Blank rows = not tested)  LOWER EXTREMITY MMT:    MMT Right eval Left eval  Hip flexion 4/5 4-/5 P in L anterior hip  Hip extension 4+/5 4/5  Hip abduction 4+/5 4+/5 P in L lateral hip  Hip adduction 5/5 5/5  Hip internal rotation    Hip external rotation    Knee flexion 4/5 4-/5  Knee extension 4/5 4-/5  Ankle dorsiflexion 5/5 5/5  Ankle plantarflexion 5/5 5/5  Ankle inversion    Ankle eversion     (Blank rows = not tested)  GAIT: Distance walked: lobby to treatment area Assistive device utilized: None Level of assistance: Complete Independence Comments: no major deficits noted  TREATMENT DATE:                [facility] Adult PT Treatment:                                                DATE: 09/08/24 Therapeutic Exercise: Seated flexion  LTR Supine SLR 5 x 2 each  Supine clam GTB x10 Supine Alternating GTB Clam DKTC SKTC Palloff Press 10 x 2 each way Blue band pulldowns and Rows  10 x 2 each  Updated HEP     [facility] Adult PT Treatment:                                                DATE: 09/01/24 Therapeutic Exercise: Repeated flexion in sitting x10: LTR  Supine marching Supine physioball press 10x3, 5 sec hold Palloff press 10 x 2 each black band  Standing Row Blue Band 10 x 2  Standing pull downs Blue band 10 x 2  Seated physioball rollouts laterally  Updated HEP      [facility] Adult PT Treatment:                                                DATE: 08/25/24 Therapeutic Exercise: Repeated flexion in sitting x10: dec, better with baseline of anterior hip pain bil with lumbar extension in standing Supine physioball press x30, 5 sec hold, demonstrates in sitting Seated W stretch with physioball x15 cycles, pt has paraesthesia with anterior roll and this portion held in HEP Paloff press x30 bilaterally with black TB                                                                                                               [facility] Adult PT Treatment:                                                DATE: 08/09/24 Therapeutic Exercise: Repeated lumbar ext in standing x10, inc W baseline pain with left side glide in standing Repeated lumbar flexion in sitting x10: dec, better baseline pain with left side glide in standing 2nd set of 10 reps: dec, better  Education and HEP    PATIENT EDUCATION:  Education details: Pt educated on relevant anatomy, physiology, pathology, diagnosis, prognosis, progression of care, pain and activity modification related to low back pain Person educated: Patient Education method:  Explanation, Demonstration, and Handouts Education comprehension: verbalized understanding and returned demonstration  HOME EXERCISE PROGRAM: Access Code: [email or web address] URL: [email or web address] Date: 08/26/2024 Prepared by: Stann Ohara  Exercises - Seated Flexion Stretch  - 5 x daily - 7 x weekly - 1 sets - 10 reps - 2 hold - Seated  Abdominal Press into Swiss Ball  - 1 x daily - 7 x weekly - 2 sets - 15 reps - 2 hold - Seated 3 Way Exercise Ball Roll Out Stretch  - 1 x daily - 7 x weekly - 1 sets - 15 reps - 2-3s hold - Standing Anti-Rotation Press with Anchored Resistance  - 1 x daily - 7 x weekly - 1 sets - 30 reps - 2 hold - Standing Row with Anchored Resistance  - 1 x daily - 7 x weekly - 3 sets - 10 reps - Shoulder extension with resistance - Neutral  - 1 x daily - 7 x weekly - 3 sets - 10 reps - Hooklying Clamshell with Resistance  - 2 x daily - 7 x weekly - 1-2 sets - 8-10 reps - 5 hold  ASSESSMENT:  CLINICAL IMPRESSION: Pt reports she has been doing a lot of shopping so is stiff. Has been completing HEP and sees short term benefit until she does a lot of standing and walking again with shopping. Pt has been responding well to her current regimen, directional preference of repeated flexion in sitting confirmed. Seated lumbar flexion most relieving. Added additional flexion based stretches in supine with single and double knee to chest which she reported reduced her stiffness. Continued with rotational stabilization and scapular bands. Progressed with hip/core stabilization and updated HEP. Will plan to space out her last 2 covered visits.  Pt stands to benefit from continued skilled physical therapy to address deficit areas and restore safety with activities and participations at home and in the community.    Patient is a 68 y.o. F who was seen today for physical therapy evaluation and treatment for low back pain. Pt has been limited in function and activity tolerance as a result. Pt has hx of scoliosis and likely contributes to the feeling of muscle imbalance. Pt symptoms are consistent with impingement of the lumbar spine with directional preference of repeated flexion in sitting x10. Pt stands to benefit from continued skilled physical therapy to address deficit areas and restore safety with activities and participations  at home and in the community.    OBJECTIVE IMPAIRMENTS: decreased activity tolerance, decreased endurance, decreased mobility, decreased ROM, decreased strength, increased fascial restrictions, impaired perceived functional ability, increased muscle spasms, and pain.   ACTIVITY LIMITATIONS: carrying, lifting, sitting, standing, squatting, and stairs  PARTICIPATION LIMITATIONS: cleaning, laundry, community activity, occupation, and yard work  PERSONAL FACTORS: 1-2 comorbidities: Hx lumbar fusion and osteoporosis are also affecting patient's functional outcome.   REHAB POTENTIAL: Good  CLINICAL DECISION MAKING: Stable/uncomplicated  EVALUATION COMPLEXITY: Low   GOALS: Goals reviewed with patient? Yes  SHORT TERM GOALS: Target date: 09/13/24   Pt will report compliance with HEP to work towards ind and home management strategies Baseline: Goal status: INITIAL   2.  Pt will score no greater than 15/50 on ODI to demonstrate improved activity tolerance Baseline: 25/50 Goal status: INITIAL   3.  Pt will improve lumbar spine ROM to full and painless in order to demonstrate progress towards activity tolerance and improved function Baseline: see ROM chart Goal status: INITIAL   4.  Pt will score no less  than 40/50 on PSFS Baseline: 17/50 Goal status: INITIAL     LONG TERM GOALS: Target date: 10/18/24   Pt will score no greater than 5/50 on ODI to demonstrate improved activity tolerance Baseline: 25/50 Goal status: INITIAL   2.  Pt will report no greater than 1/10 pain over 7 consecutive days to demonstrate maintained reduction in symptoms and improved tolerance to activity Baseline: 1/10-8/10 Goal status: INITIAL   3.  Pt will be ind in the management of their symptoms at home and in the community Baseline:  Goal status: INITIAL     PLAN:  PT FREQUENCY: 1-2x/week  PT DURATION: other: 9 weeks  PLANNED INTERVENTIONS: 97110-Therapeutic exercises, 97530- Therapeutic  activity, [id number]- Neuromuscular re-education, 97535- Self Care, 02859- Manual therapy, [id number]- Electrical stimulation (unattended), 20560 (1-2 muscles), 20561 (3+ muscles)- Dry Needling, Patient/Family education, Cryotherapy, and Moist heat.  PLAN FOR NEXT SESSION: Reassess response to repeated end range joint movement testing, progress strength, stability, endurance, motor control to lower quarter through functional movement patterns   Harlene Persons, PTA 09/08/2024 3:50 PM Phone: [phone] Fax: [phone]   Date of referral: 07/26/24 Referring provider: Emil Aloysius Schaumann, MD Referring diagnosis? Back injury, s39.92XA, musculoskeletal pain M78.18 Treatment diagnosis? (if different than referring diagnosis) m54.59  What was this (referring dx) caused by? Felton Hawks of Condition: Initial Onset (within last 3 months)   Laterality: Both  Current Functional Measure Score: Back Index 25/50  Objective measurements identify impairments when they are compared to normal values, the uninvolved extremity, and prior level of function.  [x]  Yes  []  No  Objective assessment of functional ability: Moderate functional limitations   Briefly describe symptoms: ache, burning  How did symptoms start: fell outside landing on anterior trunk  Average pain intensity:  Last 24 hours: 2/10  Past week: 4/10  How often does the pt experience symptoms? Constantly  How much have the symptoms interfered with usual daily activities? Moderately  How has condition changed since care began at this facility? NA - initial visit  In general, how is the patients overall health? Excellent   BACK PAIN (STarT Back Screening Tool) No

## 2024-09-21 ENCOUNTER — Encounter: Payer: Self-pay | Admitting: Physical Therapy

## 2024-09-21 ENCOUNTER — Ambulatory Visit: Admitting: Physical Therapy

## 2024-09-21 DIAGNOSIS — M5459 Other low back pain: Secondary | ICD-10-CM

## 2024-09-21 NOTE — Therapy (Signed)
 " OUTPATIENT PHYSICAL THERAPY THORACOLUMBAR TREATMENT   Patient Name: Teresa Fitzgerald MRN: [id number] DOB:1956-06-05, 68 y.o., female Today's Date: 09/21/2024  END OF SESSION:  PT End of Session - 09/21/24 1453     Visit Number 5    Number of Visits 10    Date for Recertification  10/18/24    Authorization Type [employer]    Authorization Time Period 08/09/24-10/04/24    Authorization - Visit Number 5    Authorization - Number of Visits 6    PT Start Time 1450    PT Stop Time 1530    PT Time Calculation (min) 40 min    Activity Tolerance Patient tolerated treatment well    Behavior During Therapy [facility] for tasks assessed/performed             Past Medical History:  Diagnosis Date   Allergic rhinitis    Allergy    [employer] 10/20/2020   Asthma    onset childhood; avoids trigger; Wert consultatoin in 2014. Dulera prescribed.   Asthma    [employer] 10/20/2020   Hypercholesteremia    Hyperlipidemia    [employer] 10/20/2020   Mitral valve prolapse    Osteopenia    SVT (supraventricular tachycardia)    presumed atrial tachycardia not inducible on EPS 12/11   Past Surgical History:  Procedure Laterality Date   EP study  12/11   by JA, no inducible arrhythmia   LUMBAR FUSION  2000   SPINE SURGERY N/A    [employer] 10/20/2020   TUBAL LIGATION  1988   Patient Active Problem List   Diagnosis Date Noted   Musculoskeletal pain 07/26/2024   Back injury, initial encounter 07/26/2024   History of osteoporosis 07/26/2024   Dyslipidemia 04/22/2021   Age-related osteoporosis without current pathological fracture 10/21/2018   Seasonal allergic rhinitis due to pollen 04/11/2017   Other nonrheumatic mitral valve disorders 01/08/2016   SVT/ PSVT/ PAT 12/28/2009   Asthma 12/27/2009   HYPERCHOLESTEROLEMIA 10/26/2009    PCP: Emil Aloysius Schaumann, MD  REFERRING PROVIDER: Emil Aloysius Schaumann, MD  REFERRING DIAG: [phone] (ICD-10-CM) - Back injury, initial encounter   M79.18  (ICD-10-CM) - Musculoskeletal pain  Rationale for Evaluation and Treatment: Rehabilitation  THERAPY DIAG:  Other low back pain  ONSET DATE: October 2025  SUBJECTIVE:                                                                                                                                                                                           SUBJECTIVE STATEMENT: Pt states that she has been doing well since her previous session, reports semi compliance with HEP with  the holidays. Notes that she feels remaining 10% is improving tightness.   EVAL: Pt states that in the beginning of October, she fell and hurt her shoulder and her low back while walking down an embankment, things improved and she feels she may have done too much and had a flare up. Went to doctor who ruled out new pathology. Overall symptoms have been improving, described as dull ache. She has tried heat/cold/KT tape. 1/10-8/10 range depending on time of day, worse in evenings or at the end of the day. Pt denies radicular symptoms. Notes that low back pain is more on the left side. Anything elongating the left side will inc symptoms. Reports hx of scoliosis.  PERTINENT HISTORY:  Back injury, initial encounter - Primary   Persistent pain affecting quality of life However clinically stable with no red flag signs or symptoms. Recommend x-rays today.  Will review images when available Pain management discussed Differential diagnosis discussed Recommend Tylenol for mild to moderate pain and tramadol  for moderate to severe pain    PAIN:  Are you having pain? Yes: NPRS scale: 4-6/10 Pain location: L low back  Pain description: ache/burn Aggravating factors: reaching overhead, stretching, end of day, sitting up straight  Relieving factors: laying flat, bending forward  PRECAUTIONS: None  RED FLAGS: None   WEIGHT BEARING RESTRICTIONS: No  FALLS:  Has patient fallen in last 6 months? Yes. 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  DIAGNOSTIC FINDINGS:  FINDINGS: No acute fracture or subluxation of the thoracic spine. The bones are osteopenic. Multilevel degenerative changes. The soft tissues are unremarkable   IMPRESSION: No acute findings. Multilevel degenerative changes.  FINDINGS: No acute fracture or subluxation of the lumbar spine. Evaluation is however limited due to osteopenia, degenerative changes, and scoliosis. L2-L3 and L3-L4 ray cages noted. The soft tissues are unremarkable.   IMPRESSION: 1. No acute fracture or subluxation. 2. Degenerative changes and scoliosis.  PATIENT SURVEYS:  Modified Oswestry:  MODIFIED OSWESTRY DISABILITY SCALE  Date: 08/09/24 Score  Pain intensity 1 = The pain is bad, but I can manage without having to take pain medication  2. Personal care (washing, dressing, etc.) 2 =  It is painful to take care of myself, and I am slow and careful.  3. Lifting 3 = Pain prevents me from lifting heavy weights, but I can manage light to medium weights if they are conveniently positioned  4. Walking 2 =  Pain prevents me from walking more than  mile.  5. Sitting 3 =  Pain prevents me from sitting more than  hour.  6. Standing 4 =  Pain prevents me from standing more than 10 minutes.  7. Sleeping 1 = I can sleep well only by using pain medication.  8. Social Life 3 =  Pain prevents me from going out very often.  9. Traveling 4 = My pain restricts my travel to short necessary journeys under 1/2 hour.  10. Employment/ Homemaking 2 = I can perform most of my homemaking/job duties, but pain  prevents me from performing more physically stressful activities (eg, lifting, vacuuming).  Total 25/50   Interpretation of scores: Score Category  Description  0-20% Minimal Disability The patient can cope with most living activities. Usually no treatment is indicated apart from advice on lifting, sitting and exercise  21-40% Moderate Disability The patient experiences more pain and difficulty with sitting, lifting and standing. Travel and social life are more difficult and they may be disabled from work. Personal care, sexual activity and sleeping are not grossly affected, and the patient can usually be managed by conservative means  41-60% Severe Disability Pain remains the main problem in this group, but activities of daily living are affected. These patients require a detailed investigation  61-80% Crippled Back pain impinges on all aspects of the patients life. Positive intervention is required  81-100% Bed-bound These patients are either bed-bound or exaggerating their symptoms  Bluford FORBES Zoe DELENA Karon DELENA, et al. Surgery versus conservative management of stable thoracolumbar fracture: the PRESTO feasibility RCT. [place] ([place]): [employer]; 2021 Nov. [facility] Technology Assessment, No. 25.62.) Appendix 3, Oswestry Disability Index category descriptors. Available from: Findjewelers.cz  Minimally Clinically Important Difference (MCID) = 12.8%   THE PATIENT SPECIFIC FUNCTIONAL SCALE  Place score of 0-10 (0 = unable to perform activity and 10 = able to perform activity at the same level as before injury or problem)  Activity Date: 08/09/24    Walk 5    2. Exercise 1    3.  Get up from chair 6    4. Climb up hill/bank area 1    5. Stairs 4    Total Score 17      Total Score = Sum of activity scores/number of activities  Minimally Detectable Change: 3 points (for single activity); 2 points (for average score)  [facility]  (nd). The Patient Specific Functional Scale . Retrieved from [email or web address]   COGNITION: Overall cognitive status: Within functional limits for tasks assessed     SENSATION: WFL   POSTURE: forward head  PALPATION: S curve thoracic shift L, lumbar R   LUMBAR ROM: baseline flexion is palms on floor  AROM eval 08/25/24 09/08/24  Flexion Nil loss 25% loss, end range pain, touches floor with fingertips 2 inches from floor , burning in hamstrings   Extension 75% loss, R thoracic cavitation, L low back pain 25% loss, inc anterior hip pull, L>R   Right lateral flexion 75% loss, bilateral hip pain 50% loss, low back pain and L hip pain 50%  Left low back pain  Left lateral flexion 50% loss, bilateral hip pain 25% loss, slight discomfort in the hip    Right rotation     Left rotation      (Blank rows = not tested)  LOWER EXTREMITY ROM:   LE ROM WNL  Active  Right eval Left eval  Hip flexion    Hip extension    Hip abduction    Hip adduction    Hip internal rotation    Hip external rotation    Knee flexion    Knee extension    Ankle dorsiflexion    Ankle plantarflexion    Ankle inversion    Ankle eversion     (Blank rows = not tested)  LOWER EXTREMITY MMT:    MMT Right eval Left eval  Hip flexion 4/5 4-/5 P in L anterior hip  Hip extension 4+/5 4/5  Hip abduction 4+/5 4+/5 P in L lateral hip  Hip adduction 5/5 5/5  Hip internal rotation    Hip external rotation    Knee flexion 4/5 4-/5  Knee  extension 4/5 4-/5  Ankle dorsiflexion 5/5 5/5  Ankle plantarflexion 5/5 5/5  Ankle inversion    Ankle eversion     (Blank rows = not tested)   GAIT: Distance walked: lobby to treatment area Assistive device utilized: None Level of assistance: Complete Independence Comments: no major deficits noted  TREATMENT DATE:      [facility] Adult PT Treatment:                                                DATE:  09/21/24 Therapeutic Exercise: Clams in sidelying 3x8 BLE  TrA isometric in hooklying x30 with hands for pressure Integrated marching in hooklying 3 way hip in standing x10 BLE HEP reviewed and provided, discussed progress and plans for dc next session               [facility] Adult PT Treatment:                                                DATE: 09/08/24 Therapeutic Exercise: Seated flexion  LTR Supine SLR 5 x 2 each  Supine clam GTB x10 Supine Alternating GTB Clam DKTC SKTC Palloff Press 10 x 2 each way Blue band pulldowns and Rows 10 x 2 each  Updated HEP     [facility] Adult PT Treatment:                                                DATE: 09/01/24 Therapeutic Exercise: Repeated flexion in sitting x10: LTR  Supine marching Supine physioball press 10x3, 5 sec hold Palloff press 10 x 2 each black band  Standing Row Blue Band 10 x 2  Standing pull downs Blue band 10 x 2  Seated physioball rollouts laterally  Updated HEP      [facility] Adult PT Treatment:                                                DATE: 08/25/24 Therapeutic Exercise: Repeated flexion in sitting x10: dec, better with baseline of anterior hip pain bil with lumbar extension in standing Supine physioball press x30, 5 sec hold, demonstrates in sitting Seated W stretch with physioball x15 cycles, pt has paraesthesia with anterior roll and this portion held in HEP Paloff press x30 bilaterally with black TB                                                                                                               [facility] Adult PT  Treatment:                                                DATE: 08/09/24 Therapeutic Exercise: Repeated lumbar ext in standing x10, inc W baseline pain with left side glide in standing Repeated lumbar flexion in sitting x10: dec, better baseline pain with left side glide in standing 2nd set of 10 reps: dec, better  Education and HEP    PATIENT EDUCATION:  Education details: Pt  educated on relevant anatomy, physiology, pathology, diagnosis, prognosis, progression of care, pain and activity modification related to low back pain Person educated: Patient Education method: Explanation, Demonstration, and Handouts Education comprehension: verbalized understanding and returned demonstration  HOME EXERCISE PROGRAM: Access Code: [email or web address] URL: [email or web address] Date: 09/21/2024 Prepared by: Stann Ohara  Exercises - Seated Flexion Stretch  - 5 x daily - 7 x weekly - 1 sets - 10 reps - 2 hold - Seated 3 Way Exercise Ball Roll Out Stretch  - 1 x daily - 4 x weekly - 1 sets - 15 reps - 2-3s hold - Standing Anti-Rotation Press with Anchored Resistance  - 1 x daily - 4 x weekly - 1 sets - 30 reps - 2 hold - Standing Row with Anchored Resistance  - 1 x daily - 4 x weekly - 3 sets - 10 reps - Shoulder extension with resistance - Neutral  - 1 x daily - 4 x weekly - 3 sets - 10 reps - Clamshell  - 1 x daily - 4 x weekly - 3 sets - 8 reps - 2 hold - Supine Transversus Abdominis Bracing - Hands on Stomach  - 1 x daily - 4 x weekly - 1 sets - 30 reps - 3-5s hold  ASSESSMENT:  CLINICAL IMPRESSION: Pt has been responding well to her current regimen, demonstrates improved education and ind with symptoms. Able to progress core strength and stability today with god form. Will plan for dc next session if indications continue.    Patient is a 68 y.o. F who was seen today for physical therapy evaluation and treatment for low back pain. Pt has been limited in function and activity tolerance as a result. Pt has hx of scoliosis and likely contributes to the feeling of muscle imbalance. Pt symptoms are consistent with impingement of the lumbar spine with directional preference of repeated flexion in sitting x10. Pt stands to benefit from continued skilled physical therapy to address deficit areas and restore safety with activities and participations at home and in the  community.    OBJECTIVE IMPAIRMENTS: decreased activity tolerance, decreased endurance, decreased mobility, decreased ROM, decreased strength, increased fascial restrictions, impaired perceived functional ability, increased muscle spasms, and pain.   ACTIVITY LIMITATIONS: carrying, lifting, sitting, standing, squatting, and stairs  PARTICIPATION LIMITATIONS: cleaning, laundry, community activity, occupation, and yard work  PERSONAL FACTORS: 1-2 comorbidities: Hx lumbar fusion and osteoporosis are also affecting patient's functional outcome.   REHAB POTENTIAL: Good  CLINICAL DECISION MAKING: Stable/uncomplicated  EVALUATION COMPLEXITY: Low   GOALS: Goals reviewed with patient? Yes  SHORT TERM GOALS: Target date: 09/13/24   Pt will report compliance with HEP to work towards ind and home management strategies Baseline: Goal status: INITIAL   2.  Pt will score no greater than 15/50 on ODI to demonstrate improved activity tolerance Baseline: 25/50 Goal status: INITIAL   3.  Pt will improve lumbar spine ROM to full and painless in order to demonstrate progress towards activity tolerance and improved function Baseline: see ROM chart Goal status: INITIAL   4.  Pt will score no less than 40/50 on PSFS Baseline: 17/50 Goal status: INITIAL     LONG TERM GOALS: Target date: 10/18/24   Pt will score no greater than 5/50 on ODI to demonstrate improved activity tolerance Baseline: 25/50 Goal status: INITIAL   2.  Pt will report no greater than 1/10 pain over 7 consecutive days to demonstrate maintained reduction in symptoms and improved tolerance to activity Baseline: 1/10-8/10 Goal status: INITIAL   3.  Pt will be ind in the management of their symptoms at home and in the community Baseline:  Goal status: INITIAL     PLAN:  PT FREQUENCY: 1-2x/week  PT DURATION: other: 9 weeks  PLANNED INTERVENTIONS: 97110-Therapeutic exercises, 97530- Therapeutic activity, [id number]  Neuromuscular re-education, 97535- Self Care, 02859- Manual therapy, [id number]- Electrical stimulation (unattended), 20560 (1-2 muscles), 20561 (3+ muscles)- Dry Needling, Patient/Family education, Cryotherapy, and Moist heat.  PLAN FOR NEXT SESSION: Reassess response to repeated end range joint movement testing, progress strength, stability, endurance, motor control to lower quarter through functional movement patterns  Stann Ohara PT, DPT, CLT, CES 09/21/2024 2:54 PM    Date of referral: 07/26/24 Referring provider: Emil Aloysius Schaumann, MD Referring diagnosis? Back injury, s39.92XA, musculoskeletal pain M78.18 Treatment diagnosis? (if different than referring diagnosis) m54.59  What was this (referring dx) caused by? Felton Hawks of Condition: Initial Onset (within last 3 months)   Laterality: Both  Current Functional Measure Score: Back Index 25/50  Objective measurements identify impairments when they are compared to normal values, the uninvolved extremity, and prior level of function.  [x]  Yes  []  No  Objective assessment of functional ability: Moderate functional limitations   Briefly describe symptoms: ache, burning  How did symptoms start: fell outside landing on anterior trunk  Average pain intensity:  Last 24 hours: 2/10  Past week: 4/10  How often does the pt experience symptoms? Constantly  How much have the symptoms interfered with usual daily activities? Moderately  How has condition changed since care began at this facility? NA - initial visit  In general, how is the patients overall health? Excellent   BACK PAIN (STarT Back Screening Tool) No   "

## 2024-10-03 ENCOUNTER — Ambulatory Visit: Attending: Emergency Medicine | Admitting: Physical Therapy

## 2024-10-03 ENCOUNTER — Encounter: Payer: Self-pay | Admitting: Physical Therapy

## 2024-10-03 DIAGNOSIS — M5459 Other low back pain: Secondary | ICD-10-CM | POA: Diagnosis present

## 2024-10-03 NOTE — Therapy (Signed)
 " OUTPATIENT PHYSICAL THERAPY THORACOLUMBAR TREATMENT/DISCHARGE  Visits: 6 Date Range: 08/09/24-10/03/24 Comments: Pt has been able to attempt hiking, objective and subjective improvements and inc independence noted   Patient Name: Teresa Fitzgerald MRN: [id number] DOB:06-29-1956, 69 y.o., female Today's Date: 10/03/2024  END OF SESSION:  PT End of Session - 10/03/24 1445     Visit Number 6    Number of Visits 10    Date for Recertification  10/18/24    Authorization Type [employer]    Authorization Time Period 08/09/24-10/04/24    Authorization - Visit Number 6    Authorization - Number of Visits 6    PT Start Time 1445    PT Stop Time 1530    PT Time Calculation (min) 45 min    Activity Tolerance Patient tolerated treatment well    Behavior During Therapy [facility] for tasks assessed/performed              Past Medical History:  Diagnosis Date   Allergic rhinitis    Allergy    [employer] 10/20/2020   Asthma    onset childhood; avoids trigger; Wert consultatoin in 2014. Dulera prescribed.   Asthma    [employer] 10/20/2020   Hypercholesteremia    Hyperlipidemia    [employer] 10/20/2020   Mitral valve prolapse    Osteopenia    SVT (supraventricular tachycardia)    presumed atrial tachycardia not inducible on EPS 12/11   Past Surgical History:  Procedure Laterality Date   EP study  12/11   by JA, no inducible arrhythmia   LUMBAR FUSION  2000   SPINE SURGERY N/A    [employer] 10/20/2020   TUBAL LIGATION  1988   Patient Active Problem List   Diagnosis Date Noted   Musculoskeletal pain 07/26/2024   Back injury, initial encounter 07/26/2024   History of osteoporosis 07/26/2024   Dyslipidemia 04/22/2021   Age-related osteoporosis without current pathological fracture 10/21/2018   Seasonal allergic rhinitis due to pollen 04/11/2017   Other nonrheumatic mitral valve disorders 01/08/2016   SVT/ PSVT/ PAT 12/28/2009   Asthma 12/27/2009   HYPERCHOLESTEROLEMIA 10/26/2009     PCP: Emil Aloysius Schaumann, MD  REFERRING PROVIDER: Emil Aloysius Schaumann, MD  REFERRING DIAG: [id number] (ICD-10-CM) - Back injury, initial encounter   M79.18 (ICD-10-CM) - Musculoskeletal pain  Rationale for Evaluation and Treatment: Rehabilitation  THERAPY DIAG:  Other low back pain  ONSET DATE: October 2025  SUBJECTIVE:  SUBJECTIVE STATEMENT: Pt states that she has been doing well overall since her previous session, reports compliance with HEP. Reports being able to push herself and negotiate banks and uneven surfaces in the woods, has been able to hike off trail, but states that she has inc stiffness and tightness as a result, able to alleviate symptoms with her exercises. Reports no improvement with clams, left side maintains tenderness following.    EVAL: Pt states that in the beginning of October, she fell and hurt her shoulder and her low back while walking down an embankment, things improved and she feels she may have done too much and had a flare up. Went to doctor who ruled out new pathology. Overall symptoms have been improving, described as dull ache. She has tried heat/cold/KT tape. 1/10-8/10 range depending on time of day, worse in evenings or at the end of the day. Pt denies radicular symptoms. Notes that low back pain is more on the left side. Anything elongating the left side will inc symptoms. Reports hx of scoliosis.  PERTINENT HISTORY:  Back injury, initial encounter - Primary   Persistent pain affecting quality of life However clinically stable with no red flag signs or symptoms. Recommend x-rays today.  Will review images when available Pain management discussed Differential diagnosis discussed Recommend Tylenol for mild to moderate pain and tramadol  for moderate to severe pain     PAIN:  Are you having pain? Yes: NPRS scale: 3-4/10 Pain location: L low back  Pain description: ache/burn Aggravating factors: reaching overhead, stretching, end of day, sitting up straight  Relieving factors: laying flat, bending forward  PRECAUTIONS: None  RED FLAGS: None   WEIGHT BEARING RESTRICTIONS: No  FALLS:  Has patient fallen in last 6 months? Yes. 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  DIAGNOSTIC FINDINGS:  FINDINGS: No acute fracture or subluxation of the thoracic spine. The bones are osteopenic. Multilevel degenerative changes. The soft tissues are unremarkable   IMPRESSION: No acute findings. Multilevel degenerative changes.  FINDINGS: No acute fracture or subluxation of the lumbar spine. Evaluation is however limited due to osteopenia, degenerative changes, and scoliosis. L2-L3 and L3-L4 ray cages noted. The soft tissues are unremarkable.   IMPRESSION: 1. No acute fracture or subluxation. 2. Degenerative changes and scoliosis.  PATIENT SURVEYS:  Modified Oswestry:  MODIFIED OSWESTRY DISABILITY SCALE 10/03/24  Date: 08/09/24 Score   Pain intensity 1 = The pain is bad, but I can manage without having to take pain medication 0  2. Personal care (washing, dressing, etc.) 2 =  It is painful to take care of myself, and I am slow and careful. 0  3. Lifting 3 = Pain prevents me from lifting heavy weights, but I can manage light to medium weights if they are conveniently positioned 1  4. Walking 2 =  Pain prevents me from walking more than  mile. 0  5. Sitting 3 =  Pain prevents me  from sitting more than  hour. 2  6. Standing 4 =  Pain prevents me from standing more than 10 minutes. 3  7. Sleeping 1 = I can sleep well only by using pain medication. 1  8. Social Life 3 =  Pain prevents me from going out very often. 0  9. Traveling 4 = My pain restricts my travel to short necessary journeys under 1/2 hour. 1  10. Employment/ Homemaking 2 = I can perform most of my homemaking/job duties, but pain prevents me from performing more physically stressful activities (eg, lifting, vacuuming). 0  Total 25/50 8/50   Interpretation of scores: Score Category Description  0-20% Minimal Disability The patient can cope with most living activities. Usually no treatment is indicated apart from advice on lifting, sitting and exercise  21-40% Moderate Disability The patient experiences more pain and difficulty with sitting, lifting and standing. Travel and social life are more difficult and they may be disabled from work. Personal care, sexual activity and sleeping are not grossly affected, and the patient can usually be managed by conservative means  41-60% Severe Disability Pain remains the main problem in this group, but activities of daily living are affected. These patients require a detailed investigation  61-80% Crippled Back pain impinges on all aspects of the patients life. Positive intervention is required  81-100% Bed-bound These patients are either bed-bound or exaggerating their symptoms  Bluford FORBES Zoe DELENA Karon DELENA, et al. Surgery versus conservative management of stable thoracolumbar fracture: the PRESTO feasibility RCT. [place] ([place]): [employer]; 2021 Nov. [facility] Technology Assessment, No. 25.62.) Appendix 3, Oswestry Disability Index category descriptors. Available from: Findjewelers.cz  Minimally Clinically Important Difference (MCID) = 12.8%   THE PATIENT SPECIFIC FUNCTIONAL SCALE  Place score of 0-10 (0 = unable to perform  activity and 10 = able to perform activity at the same level as before injury or problem)  Activity Date: 08/09/24 10/03/24   Walk 5 8   2. Exercise 1 10   3.  Get up from chair 6 10   4. Climb up hill/bank area 1 7   5. Stairs 4 10   Total Score 17 45     Total Score = Sum of activity scores/number of activities  Minimally Detectable Change: 3 points (for single activity); 2 points (for average score)  [facility] (nd). The Patient Specific Functional Scale . Retrieved from Skateoasis.com.[email or web address]   COGNITION: Overall cognitive status: Within functional limits for tasks assessed     SENSATION: WFL   POSTURE: forward head  PALPATION: S curve thoracic shift L, lumbar R   LUMBAR ROM: baseline flexion is palms on floor  AROM eval 08/25/24 09/08/24 10/03/24  Flexion Nil loss 25% loss, end range pain, touches floor with fingertips 2 inches from floor , burning in hamstrings  Toe touch, symmetric pull in hamstrings BLE  Extension 75% loss, R thoracic cavitation, L low back pain 25% loss, inc anterior hip pull, L>R  25% loss, inc L sided low back pain  Right lateral flexion 75% loss, bilateral hip pain 50% loss, low back pain and L hip pain 50%  Left low back pain Nil loss, no symptoms  Left lateral flexion 50% loss, bilateral hip pain 25% loss, slight discomfort in the hip   25% loss, anterior L hip tension  Right rotation      Left rotation       (Blank rows = not tested)  LOWER EXTREMITY ROM:   LE ROM WNL  Active  Right eval Left eval  Hip flexion    Hip extension    Hip abduction    Hip adduction    Hip internal rotation    Hip external rotation  Knee flexion    Knee extension    Ankle dorsiflexion    Ankle plantarflexion    Ankle inversion    Ankle eversion     (Blank rows = not tested)  LOWER EXTREMITY MMT:    MMT Right eval Left eval Right  10/03/24 Left 10/03/24  Hip flexion 4/5 4-/5 P in L  anterior hip 5/5 5/5  Hip extension 4+/5 4/5 5/5 5/5  Hip abduction 4+/5 4+/5 P in L lateral hip 5/5 5/5  Hip adduction 5/5 5/5 5/5   Hip internal rotation      Hip external rotation      Knee flexion 4/5 4-/5 5/5   Knee extension 4/5 4-/5 5/5   Ankle dorsiflexion 5/5 5/5    Ankle plantarflexion 5/5 5/5    Ankle inversion      Ankle eversion       (Blank rows = not tested)   GAIT: Distance walked: lobby to treatment area Assistive device utilized: None Level of assistance: Complete Independence Comments: no major deficits noted  TREATMENT DATE:      [facility] Adult PT Treatment:                                                DATE: 10/03/24  Therapeutic Activity: PT POC reviewed and discussed, goals assessed and updated, tests and measures completed. DC edu completed.     [facility] Adult PT Treatment:                                                DATE: 09/21/24 Therapeutic Exercise: Clams in sidelying 3x8 BLE  TrA isometric in hooklying x30 with hands for pressure Integrated marching in hooklying 3 way hip in standing x10 BLE HEP reviewed and provided, discussed progress and plans for dc next session               [facility] Adult PT Treatment:                                                DATE: 09/08/24 Therapeutic Exercise: Seated flexion  LTR Supine SLR 5 x 2 each  Supine clam GTB x10 Supine Alternating GTB Clam DKTC SKTC Palloff Press 10 x 2 each way Blue band pulldowns and Rows 10 x 2 each  Updated HEP     [facility] Adult PT Treatment:                                                DATE: 09/01/24 Therapeutic Exercise: Repeated flexion in sitting x10: LTR  Supine marching Supine physioball press 10x3, 5 sec hold Palloff press 10 x 2 each black band  Standing Row Blue Band 10 x 2  Standing pull downs Blue band 10 x 2  Seated physioball rollouts laterally  Updated HEP      PATIENT EDUCATION:  Education details: Pt educated on relevant anatomy, physiology,  pathology, diagnosis, prognosis, progression of care, pain and activity  modification related to low back pain Person educated: Patient Education method: Explanation, Demonstration, and Handouts Education comprehension: verbalized understanding and returned demonstration  HOME EXERCISE PROGRAM: Access Code: [email or web address] URL: [email or web address] Date: 10/03/2024 Prepared by: Stann Ohara  Exercises - Seated Flexion Stretch  - 5 x daily - 7 x weekly - 1 sets - 10 reps - 2 hold - Seated 3 Way Exercise Ball Roll Out Stretch  - 1 x daily - 4 x weekly - 1 sets - 15 reps - 2-3s hold - Standing Anti-Rotation Press with Anchored Resistance  - 1 x daily - 4 x weekly - 1 sets - 30 reps - 2 hold - Standing Row with Anchored Resistance  - 1 x daily - 4 x weekly - 3 sets - 10 reps - Shoulder extension with resistance - Neutral  - 1 x daily - 4 x weekly - 3 sets - 10 reps - Supine Transversus Abdominis Bracing - Hands on Stomach  - 1 x daily - 4 x weekly - 1 sets - 30 reps - 3-5s hold - Standing 3-way Hip with Walker  - 1 x daily - 4 x weekly - 1 sets - 10-15 reps - 2 hold - Supine Sciatic Nerve Glide  - 1 x daily - 4 x weekly - 3 sets - 10-15 reps - 2 hold  ASSESSMENT:  CLINICAL IMPRESSION: Pt has responded well to her PT POC. Education has progressed and pt demonstrates ind in the management of her symptoms. She was able to attempt climbing banks for work in the time between last episode and dc. Pt demonstrates objective improvements consistent with subjective reports. Pt educated on HEP taper, prioritization of interventions and self screening for monitoring. Pt attended 6 sessions of skilled PT and is hereby dc to HEP. Thank you for allowing me to participate in the care of Mrs. Marcus.    Patient is a 69 y.o. F who was seen today for physical therapy evaluation and treatment for low back pain. Pt has been limited in function and activity tolerance as a result. Pt has hx of scoliosis and  likely contributes to the feeling of muscle imbalance. Pt symptoms are consistent with impingement of the lumbar spine with directional preference of repeated flexion in sitting x10. Pt stands to benefit from continued skilled physical therapy to address deficit areas and restore safety with activities and participations at home and in the community.    OBJECTIVE IMPAIRMENTS: decreased activity tolerance, decreased endurance, decreased mobility, decreased ROM, decreased strength, increased fascial restrictions, impaired perceived functional ability, increased muscle spasms, and pain.   ACTIVITY LIMITATIONS: carrying, lifting, sitting, standing, squatting, and stairs  PARTICIPATION LIMITATIONS: cleaning, laundry, community activity, occupation, and yard work  PERSONAL FACTORS: 1-2 comorbidities: Hx lumbar fusion and osteoporosis are also affecting patient's functional outcome.   REHAB POTENTIAL: Good  CLINICAL DECISION MAKING: Stable/uncomplicated  EVALUATION COMPLEXITY: Low   GOALS: Goals reviewed with patient? Yes  SHORT TERM GOALS: Target date: 09/13/24   Pt will report compliance with HEP to work towards ind and home management strategies Baseline: Goal status: MET   2.  Pt will score no greater than 15/50 on ODI to demonstrate improved activity tolerance Baseline: 25/50 Goal status: MET   3.  Pt will improve lumbar spine ROM to full and painless in order to demonstrate progress towards activity tolerance and improved function Baseline: see ROM chart 10/03/24: limited in extension ROM likely structural in nature Goal status: NOT MET   4.  Pt  will score no less than 40/50 on PSFS Baseline: 17/50 10/03/24: 45/50 Goal status: MET     LONG TERM GOALS: Target date: 10/18/24   Pt will score no greater than 5/50 on ODI to demonstrate improved activity tolerance Baseline: 25/50 10/03/24: 8/50 Goal status: NOT MET   2.  Pt will report no greater than 1/10 pain over 7  consecutive days to demonstrate maintained reduction in symptoms and improved tolerance to activity Baseline: 1/10-8/10 10/03/24: 0/10-4/10 Goal status: NOT MET   3.  Pt will be ind in the management of their symptoms at home and in the community Baseline:  Goal status: MET     PLAN:  PT FREQUENCY: 1-2x/week  PT DURATION: other: 9 weeks  PLANNED INTERVENTIONS: 97110-Therapeutic exercises, 97530- Therapeutic activity, [id number]- Neuromuscular re-education, 97535- Self Care, 02859- Manual therapy, [id number]- Electrical stimulation (unattended), 20560 (1-2 muscles), 20561 (3+ muscles)- Dry Needling, Patient/Family education, Cryotherapy, and Moist heat.  PLAN FOR NEXT SESSION: Reassess response to repeated end range joint movement testing, progress strength, stability, endurance, motor control to lower quarter through functional movement patterns  Stann Ohara PT, DPT, CLT, CES 10/03/2024 10:10 PM    Date of referral: 07/26/24 Referring provider: Emil Aloysius Schaumann, MD Referring diagnosis? Back injury, s39.92XA, musculoskeletal pain M78.18 Treatment diagnosis? (if different than referring diagnosis) m54.59  What was this (referring dx) caused by? Felton Hawks of Condition: Initial Onset (within last 3 months)   Laterality: Both  Current Functional Measure Score: Back Index 25/50  Objective measurements identify impairments when they are compared to normal values, the uninvolved extremity, and prior level of function.  [x]  Yes  []  No  Objective assessment of functional ability: Moderate functional limitations   Briefly describe symptoms: ache, burning  How did symptoms start: fell outside landing on anterior trunk  Average pain intensity:  Last 24 hours: 2/10  Past week: 4/10  How often does the pt experience symptoms? Constantly  How much have the symptoms interfered with usual daily activities? Moderately  How has condition changed since care began at this facility? NA  - initial visit  In general, how is the patients overall health? Excellent   BACK PAIN (STarT Back Screening Tool) No   "

## 2024-11-02 ENCOUNTER — Ambulatory Visit
